# Patient Record
Sex: Female | Born: 1935 | Race: Black or African American | Hispanic: No | Marital: Single
Health system: Southern US, Community
[De-identification: ages and names within clinical notes are randomized; demographics above are authoritative.]

## PROBLEM LIST (undated history)

## (undated) DIAGNOSIS — S065X9A Traumatic subdural hemorrhage with loss of consciousness of unspecified duration, initial encounter: Secondary | ICD-10-CM

## (undated) DIAGNOSIS — G40909 Epilepsy, unspecified, not intractable, without status epilepticus: Secondary | ICD-10-CM

## (undated) DIAGNOSIS — N390 Urinary tract infection, site not specified: Secondary | ICD-10-CM

## (undated) DIAGNOSIS — E2740 Unspecified adrenocortical insufficiency: Secondary | ICD-10-CM

## (undated) DIAGNOSIS — I619 Nontraumatic intracerebral hemorrhage, unspecified: Secondary | ICD-10-CM

## (undated) DIAGNOSIS — S065XAA Traumatic subdural hemorrhage with loss of consciousness status unknown, initial encounter: Secondary | ICD-10-CM

## (undated) DIAGNOSIS — I69398 Other sequelae of cerebral infarction: Secondary | ICD-10-CM

---

## 2015-05-06 ENCOUNTER — Emergency Department (HOSPITAL_COMMUNITY): Payer: Medicare Other

## 2015-05-06 ENCOUNTER — Inpatient Hospital Stay (HOSPITAL_COMMUNITY)
Admission: EM | Admit: 2015-05-06 | Discharge: 2015-05-12 | DRG: 871 | Disposition: A | Discharge status: Skilled Nursing Facility | Payer: Medicare Other | Attending: Oncology | Admitting: Oncology

## 2015-05-06 ENCOUNTER — Inpatient Hospital Stay (HOSPITAL_COMMUNITY): Payer: Medicare Other

## 2015-05-06 DIAGNOSIS — T361X5A Adverse effect of cephalosporins and other beta-lactam antibiotics, initial encounter: Secondary | ICD-10-CM | POA: Diagnosis not present

## 2015-05-06 DIAGNOSIS — R402142 Coma scale, eyes open, spontaneous, at arrival to emergency department: Secondary | ICD-10-CM | POA: Diagnosis present

## 2015-05-06 DIAGNOSIS — K5909 Other constipation: Secondary | ICD-10-CM | POA: Diagnosis present

## 2015-05-06 DIAGNOSIS — I959 Hypotension, unspecified: Secondary | ICD-10-CM | POA: Diagnosis present

## 2015-05-06 DIAGNOSIS — N390 Urinary tract infection, site not specified: Secondary | ICD-10-CM | POA: Diagnosis present

## 2015-05-06 DIAGNOSIS — Y92230 Patient room in hospital as the place of occurrence of the external cause: Secondary | ICD-10-CM | POA: Diagnosis not present

## 2015-05-06 DIAGNOSIS — E2749 Other adrenocortical insufficiency: Secondary | ICD-10-CM | POA: Diagnosis present

## 2015-05-06 DIAGNOSIS — E2740 Unspecified adrenocortical insufficiency: Secondary | ICD-10-CM | POA: Diagnosis not present

## 2015-05-06 DIAGNOSIS — R402352 Coma scale, best motor response, localizes pain, at arrival to emergency department: Secondary | ICD-10-CM | POA: Diagnosis present

## 2015-05-06 DIAGNOSIS — Z85841 Personal history of malignant neoplasm of brain: Secondary | ICD-10-CM | POA: Diagnosis not present

## 2015-05-06 DIAGNOSIS — S01511A Laceration without foreign body of lip, initial encounter: Secondary | ICD-10-CM | POA: Diagnosis present

## 2015-05-06 DIAGNOSIS — G9389 Other specified disorders of brain: Secondary | ICD-10-CM | POA: Diagnosis present

## 2015-05-06 DIAGNOSIS — A419 Sepsis, unspecified organism: Secondary | ICD-10-CM | POA: Diagnosis present

## 2015-05-06 DIAGNOSIS — E162 Hypoglycemia, unspecified: Secondary | ICD-10-CM | POA: Diagnosis present

## 2015-05-06 DIAGNOSIS — R4182 Altered mental status, unspecified: Secondary | ICD-10-CM | POA: Diagnosis present

## 2015-05-06 DIAGNOSIS — R6889 Other general symptoms and signs: Secondary | ICD-10-CM | POA: Diagnosis present

## 2015-05-06 DIAGNOSIS — S066X0A Traumatic subarachnoid hemorrhage without loss of consciousness, initial encounter: Secondary | ICD-10-CM | POA: Diagnosis present

## 2015-05-06 DIAGNOSIS — I609 Nontraumatic subarachnoid hemorrhage, unspecified: Secondary | ICD-10-CM

## 2015-05-06 DIAGNOSIS — G9341 Metabolic encephalopathy: Secondary | ICD-10-CM | POA: Diagnosis present

## 2015-05-06 DIAGNOSIS — F0390 Unspecified dementia without behavioral disturbance: Secondary | ICD-10-CM | POA: Diagnosis present

## 2015-05-06 DIAGNOSIS — R001 Bradycardia, unspecified: Secondary | ICD-10-CM | POA: Diagnosis present

## 2015-05-06 DIAGNOSIS — G9340 Encephalopathy, unspecified: Secondary | ICD-10-CM | POA: Diagnosis not present

## 2015-05-06 DIAGNOSIS — W19XXXA Unspecified fall, initial encounter: Secondary | ICD-10-CM | POA: Diagnosis present

## 2015-05-06 DIAGNOSIS — I69898 Other sequelae of other cerebrovascular disease: Secondary | ICD-10-CM

## 2015-05-06 DIAGNOSIS — R680 Hypothermia, not associated with low environmental temperature: Secondary | ICD-10-CM | POA: Diagnosis present

## 2015-05-06 DIAGNOSIS — I69398 Other sequelae of cerebral infarction: Secondary | ICD-10-CM

## 2015-05-06 DIAGNOSIS — Z87440 Personal history of urinary (tract) infections: Secondary | ICD-10-CM | POA: Diagnosis not present

## 2015-05-06 DIAGNOSIS — D619 Aplastic anemia, unspecified: Secondary | ICD-10-CM | POA: Insufficient documentation

## 2015-05-06 DIAGNOSIS — R402242 Coma scale, best verbal response, confused conversation, at arrival to emergency department: Secondary | ICD-10-CM | POA: Diagnosis present

## 2015-05-06 DIAGNOSIS — N179 Acute kidney failure, unspecified: Secondary | ICD-10-CM | POA: Diagnosis present

## 2015-05-06 DIAGNOSIS — B9689 Other specified bacterial agents as the cause of diseases classified elsewhere: Secondary | ICD-10-CM | POA: Diagnosis not present

## 2015-05-06 DIAGNOSIS — Z66 Do not resuscitate: Secondary | ICD-10-CM | POA: Diagnosis present

## 2015-05-06 DIAGNOSIS — D61818 Other pancytopenia: Secondary | ICD-10-CM | POA: Diagnosis not present

## 2015-05-06 DIAGNOSIS — G40909 Epilepsy, unspecified, not intractable, without status epilepticus: Secondary | ICD-10-CM | POA: Diagnosis present

## 2015-05-06 DIAGNOSIS — D352 Benign neoplasm of pituitary gland: Secondary | ICD-10-CM | POA: Diagnosis present

## 2015-05-06 DIAGNOSIS — D61811 Other drug-induced pancytopenia: Secondary | ICD-10-CM | POA: Diagnosis not present

## 2015-05-06 HISTORY — DX: Urinary tract infection, site not specified: N39.0

## 2015-05-06 HISTORY — DX: Other sequelae of cerebral infarction: I69.398

## 2015-05-06 HISTORY — DX: Epilepsy, unspecified, not intractable, without status epilepticus: G40.909

## 2015-05-06 LAB — URINALYSIS, ROUTINE W REFLEX MICROSCOPIC
BILIRUBIN URINE: NEGATIVE
Glucose, UA: 100 mg/dL — AB
HGB URINE DIPSTICK: NEGATIVE
KETONES UR: NEGATIVE mg/dL
Nitrite: NEGATIVE
PROTEIN: NEGATIVE mg/dL
SPECIFIC GRAVITY, URINE: 1.014 (ref 1.005–1.030)
pH: 7.5 (ref 5.0–8.0)

## 2015-05-06 LAB — I-STAT CHEM 8, ED
BUN: 16 mg/dL (ref 6–20)
Calcium, Ion: 1.15 mmol/L (ref 1.13–1.30)
Chloride: 105 mmol/L (ref 101–111)
Creatinine, Ser: 1.3 mg/dL — ABNORMAL HIGH (ref 0.44–1.00)
Glucose, Bld: 78 mg/dL (ref 65–99)
HCT: 45 % (ref 36.0–46.0)
Hemoglobin: 15.3 g/dL — ABNORMAL HIGH (ref 12.0–15.0)
Potassium: 4.6 mmol/L (ref 3.5–5.1)
Sodium: 143 mmol/L (ref 135–145)
TCO2: 24 mmol/L (ref 0–100)

## 2015-05-06 LAB — PROTIME-INR
INR: 1.11 (ref 0.00–1.49)
Prothrombin Time: 14.5 seconds (ref 11.6–15.2)

## 2015-05-06 LAB — DIFFERENTIAL
Basophils Absolute: 0 K/uL (ref 0.0–0.1)
Basophils Relative: 0 %
Eosinophils Absolute: 0 K/uL (ref 0.0–0.7)
Eosinophils Relative: 0 %
Lymphocytes Relative: 16 %
Lymphs Abs: 0.9 K/uL (ref 0.7–4.0)
Monocytes Absolute: 0.4 K/uL (ref 0.1–1.0)
Monocytes Relative: 8 %
Neutro Abs: 4.1 K/uL (ref 1.7–7.7)
Neutrophils Relative %: 76 %

## 2015-05-06 LAB — CBC
HEMATOCRIT: 38.6 % (ref 36.0–46.0)
Hemoglobin: 13 g/dL (ref 12.0–15.0)
MCH: 29 pg (ref 26.0–34.0)
MCHC: 33.7 g/dL (ref 30.0–36.0)
MCV: 86.2 fL (ref 78.0–100.0)
PLATELETS: 151 10*3/uL (ref 150–400)
RBC: 4.48 MIL/uL (ref 3.87–5.11)
RDW: 15.3 % (ref 11.5–15.5)
WBC: 5.4 10*3/uL (ref 4.0–10.5)

## 2015-05-06 LAB — URINE MICROSCOPIC-ADD ON

## 2015-05-06 LAB — COMPREHENSIVE METABOLIC PANEL
ALK PHOS: 157 U/L — AB (ref 38–126)
ALT: 37 U/L (ref 14–54)
AST: 49 U/L — AB (ref 15–41)
Albumin: 3.6 g/dL (ref 3.5–5.0)
Anion gap: 16 — ABNORMAL HIGH (ref 5–15)
BUN: 13 mg/dL (ref 6–20)
CALCIUM: 9.5 mg/dL (ref 8.9–10.3)
CHLORIDE: 104 mmol/L (ref 101–111)
CO2: 24 mmol/L (ref 22–32)
CREATININE: 1.36 mg/dL — AB (ref 0.44–1.00)
GFR calc Af Amer: 39 mL/min — ABNORMAL LOW (ref >60)
GFR calc non Af Amer: 34 mL/min — ABNORMAL LOW (ref >60)
GLUCOSE: 83 mg/dL (ref 65–99)
Potassium: 4.8 mmol/L (ref 3.5–5.1)
SODIUM: 144 mmol/L (ref 135–145)
Total Bilirubin: 0.2 mg/dL — ABNORMAL LOW (ref 0.3–1.2)
Total Protein: 7 g/dL (ref 6.5–8.1)

## 2015-05-06 LAB — RAPID URINE DRUG SCREEN, HOSP PERFORMED
Amphetamines: NOT DETECTED
Barbiturates: NOT DETECTED
Benzodiazepines: NOT DETECTED
Cocaine: NOT DETECTED
Opiates: NOT DETECTED
Tetrahydrocannabinol: NOT DETECTED

## 2015-05-06 LAB — MAGNESIUM: Magnesium: 2.2 mg/dL (ref 1.7–2.4)

## 2015-05-06 LAB — TROPONIN I

## 2015-05-06 LAB — TSH: TSH: 1.348 u[IU]/mL (ref 0.350–4.500)

## 2015-05-06 LAB — CBG MONITORING, ED: GLUCOSE-CAPILLARY: 62 mg/dL — AB (ref 65–99)

## 2015-05-06 LAB — APTT: aPTT: 35 s (ref 24–37)

## 2015-05-06 LAB — PHOSPHORUS: PHOSPHORUS: 3.2 mg/dL (ref 2.5–4.6)

## 2015-05-06 LAB — I-STAT TROPONIN, ED: Troponin i, poc: 0 ng/mL (ref 0.00–0.08)

## 2015-05-06 LAB — MRSA PCR SCREENING: MRSA BY PCR: NEGATIVE

## 2015-05-06 LAB — ETHANOL

## 2015-05-06 MED ORDER — SODIUM CHLORIDE 0.9 % IV SOLN: 75.0000 mL/h | INTRAVENOUS | Status: DC

## 2015-05-06 MED ORDER — ACETAMINOPHEN 325 MG PO TABS: 650.0000 mg | ORAL_TABLET | ORAL | Status: DC | PRN

## 2015-05-06 MED ORDER — ONDANSETRON HCL 4 MG/2ML IJ SOLN: 4.0000 mg | Freq: Four times a day (QID) | INTRAMUSCULAR | Status: DC | PRN

## 2015-05-06 MED ORDER — LORAZEPAM 2 MG/ML IJ SOLN: 1.0000 mg | INTRAMUSCULAR | Status: DC | PRN

## 2015-05-06 MED ORDER — SODIUM CHLORIDE 0.9 % IV SOLN
INTRAVENOUS | Status: DC
  Administered 2015-05-06: 19:00:00 via INTRAVENOUS
  Administered 2015-05-07: 1000 mL via INTRAVENOUS

## 2015-05-06 MED ORDER — SODIUM CHLORIDE 0.9 % IV SOLN
1500.0000 mg | Freq: Two times a day (BID) | INTRAVENOUS | Status: DC
  Administered 2015-05-06 – 2015-05-09 (×6): 1500 mg via INTRAVENOUS
  Filled 2015-05-06 (×8): qty 15

## 2015-05-06 MED ORDER — ACETAMINOPHEN 650 MG RE SUPP: 650.0000 mg | RECTAL | Status: DC | PRN

## 2015-05-06 MED ORDER — ONDANSETRON HCL 4 MG PO TABS: 4.0000 mg | ORAL_TABLET | Freq: Four times a day (QID) | ORAL | Status: DC | PRN

## 2015-05-06 MED ORDER — DEXTROSE 5 % IV SOLN
1.0000 g | INTRAVENOUS | Status: DC
  Administered 2015-05-06 – 2015-05-08 (×3): 1 g via INTRAVENOUS
  Filled 2015-05-06 (×4): qty 10

## 2015-05-06 MED ORDER — DEXTROSE 50 % IV SOLN
50.0000 mL | Freq: Once | INTRAVENOUS | Status: AC
  Administered 2015-05-06: 50 mL via INTRAVENOUS
  Filled 2015-05-06: qty 50

## 2015-05-06 NOTE — Consult Note (Signed)
Requesting Physician: Dr. Rhina Brackett, ER resident    Reason for consultation: Altered mental status  HPI:                                                                                                                                         Mariah Arias is an 82 y.o. female patient who presented with with altered mental status from a group home. Patient has advanced dementia, history of some brain cancer and seizures from the available documentation, she is on Keppra 1000 mg twice a day. Patient unable to provide history no family members or STAFF AVAILABLE TO PROVIDE ANY ADDITIONAL HISTORY. INFORMATION OBTAINED FROM THE EMS STAFF WHO PICKED HER UP. After the patient was at her baseline and in walking in the hallway in the group home with a walker. At about 2 PM, She fell fell down, unknown if it's witnessed. Since the fall she has been having continued altered mental status. No definite information about any seizure-like activity.  Initial imaging studies in the ER showed mild subarachnoid hemorrhage along the vertex bilaterally on the CT of the head, chronic right frontoparietal encephalomalacia noted. CT of the maxillary bones showed no acute fracture although she does have some acute missing teeth which are noted clinically as well. She does have some bleeding inside her mouth..    Date last known well:  05/06/2015 Time last known well:  1400 tPA Given: No: Clinical evaluation concerning for seizures, subarachnoid hemorrhage on CT brain  Stroke Risk Factors - None  Past Medical History: No past medical history on file.  No past surgical history on file.  Family History: No family history on file.  Social History:   has no tobacco, alcohol, and drug history on file.  Allergies:  No Known Allergies   Medications:                                                                                                                         Current facility-administered medications:  .   dextrose 50 % solution 50 mL, 50 mL, Intravenous, Once, Ezequiel Essex, MD .  levETIRAcetam (KEPPRA) 1,500 mg in sodium chloride 0.9 % 100 mL IVPB, 1,500 mg, Intravenous, Q12H, Sandford Diop Fuller Mandril, MD  Current outpatient prescriptions:  .  docusate sodium (COLACE) 100 MG capsule, Take 100 mg by mouth 2 (two) times daily as needed for mild  constipation., Disp: , Rfl:  .  levETIRAcetam (KEPPRA) 1000 MG tablet, Take 1,000 mg by mouth 2 (two) times daily., Disp: , Rfl:  .  nitrofurantoin, macrocrystal-monohydrate, (MACROBID) 100 MG capsule, Take 100 mg by mouth daily., Disp: , Rfl:    ROS:                                                                                                                                       History  unobtainable from patient due to mental status   Neurologic Examination:                                                                                                    Blood pressure 99/59, pulse 56, temperature 92.7 F (33.7 C), temperature source Rectal, resp. rate 14, SpO2 99 %.  Patient is very drowsy, opens eyes to verbal commands. No specific gaze deviation. She does not follow commands nonverbal. Cranial nerve II visual grossly appear intact. She withdraws to stimulation in all 4 extremities,  Limited neurological exam-due to poor cooperation. No abnormal convulsive activity or tremors noted.      Lab Results: Basic Metabolic Panel:  Recent Labs Lab 05/06/15 1459  NA 143  K 4.6  CL 105  GLUCOSE 78  BUN 16  CREATININE 1.30*    Liver Function Tests: No results for input(s): AST, ALT, ALKPHOS, BILITOT, PROT, ALBUMIN in the last 168 hours. No results for input(s): LIPASE, AMYLASE in the last 168 hours. No results for input(s): AMMONIA in the last 168 hours.  CBC:  Recent Labs Lab 05/06/15 1448 05/06/15 1459  WBC 5.4  --   NEUTROABS 4.1  --   HGB 13.0 15.3*  HCT 38.6 45.0  MCV 86.2  --   PLT 151  --     Cardiac Enzymes: No  results for input(s): CKTOTAL, CKMB, CKMBINDEX, TROPONINI in the last 168 hours.  Lipid Panel: No results for input(s): CHOL, TRIG, HDL, CHOLHDL, VLDL, LDLCALC in the last 168 hours.  CBG:  Recent Labs Lab 05/06/15 1451  GLUCAP 62*    Microbiology: No results found for this or any previous visit.   Imaging: No results found.    Assessment and plan:   Mariah Arias is an 82 y.o. female patient who presented with altered mental status. She had a fall following that she is been noted to have persistent altered mental status. Circumstances leading to the fall are unknown. She has a history of a brain tumor listed in  her paperwork sent from the group home facility. Chronic encephalomalacia is noted in the right frontoparietal cortex which could be from her chronic stroke. Seizures are also listed in her medical problem list and she is supposedly on Keppra 1000 twice a day. I suspect that the etiology for her current presentation is secondary to seizures. Recommend loading dose of Keppra 1500 mg and continue the same dose every 12 hours area which on continuous EEG monitoring for further evaluation of a nonconvulsive electrographic seizures. Based on the EEG, will add seizure medications as needed. A small subarachnoid hemorrhage in the vertex is noted on CT brain.  She is admitted to the hospitalist service and a stepdown unit for close neurological monitoring.  Blood glucose of 62 in the ER. One-time dose of D50 given. Metabolic and infectious workup per hospitalist team. We'll follow-up.

## 2015-05-06 NOTE — ED Notes (Signed)
Family arrived; states she has a brain mass that they chose not to treat. States she was having frequent seizures until a year ago, but has been controlled with medication. Has not had one in a year.

## 2015-05-06 NOTE — Progress Notes (Signed)
Orthopedic Tech Progress Note Patient Details:  Mariah Arias [birth year] [id number] MADE LEVEL 2 TRAUMA VISIT Patient ID: Mariah Arias, female   DOB: 1924/02/27, 82 y.o.   MRN: [id number]   Mariah Arias 05/06/2015, 2:52 PM

## 2015-05-06 NOTE — ED Provider Notes (Signed)
MSE was initiated and I personally evaluated the patient and placed orders (if any) at  2:56 PM on May 06, 2015.  The patient appears stable so that the remainder of the MSE may be completed by another provider.  Patient brought in by EMS after witnessed fall. She was walking with her walker when she fell forward striking her face on a hard surface. No loss of consciousness. Since the fall patient is confused and altered and not following commands consistently. History of stroke and subdural hemorrhage in the past.  Patient is awake she is oriented to person and place. She is moving her extremities spontaneously but not consistently following commands. She has swelling and abrasion to the right side of her face there is a laceration to the mucosal surface of her upper lip and what appears to be a missing right incisor  There is no C-spine tenderness. Regular rate and rhythm, lungs are clear. Abdomen is soft nontender pelvis is stable. Moving R side more than left.   Patient last normal one hour ago. Unclear whether altered mental status preceded fall. Code stroke activated. CBG 62.  D50 ordered. Seizure is also considered.  Hx brain tumor on keppra.  BP 148/90 mmHg  Pulse 62  Temp(Src) 92.7 F (33.7 C) (Rectal)  Resp 15  SpO2 100%   Ezequiel Essex, MD 05/06/15 [id number]

## 2015-05-06 NOTE — ED Notes (Signed)
From an adult group home; fall while walking with caregiver. Usually able to converse normally; post fall unable to answer questions, repeating answers, and following commands intermittently.

## 2015-05-06 NOTE — H&P (Signed)
Date: 05/06/2015               Patient Name:  Mariah Arias MRN: [id number]  DOB: 1924/04/02 Age / Sex: 82 y.o., female   PCP: Noe Gens, MD         Medical Service: Internal Medicine Teaching Service         Attending Physician: Dr. Annia Belt, MD    First Contact: Dr. Zada Finders Pager: [phone]  Second Contact: Dr. Jacques Earthly Pager: [phone]       After Hours (After 5p/  First Contact Pager: [phone]  weekends / holidays): Second Contact Pager: [phone]   Chief Complaint: Fall  History of Present Illness: Mariah Arias is an 82 year old lady with PMH of Seizures on Keppra, Hemorrhagic Stroke in 2013, and recurrent UTIs on prophylactic Macrobid who presents after a fall. Majority of history is obtained from family and chart review as patient is not communicative due to mental status change. She was apparently walking out of the bathroom using her walker when she fell face forward onto the floor. She hit her head and had an avulsion of her right incisor tooth. This was witnessed by her caretaker, whom she lives with. Family is unaware of any preceding symptoms prior to the fall and unsure if the altered mental status occurred prior to fall or after. They do report that her prior seizures were similar in presentation in which she would have an "out of it" appearance with some confusion. They say she has never had tonic-clonic seizures. They report that she is usually talkative at baseline and can ambulate using a walker without much issue. They say her last seizure was one year ago and has been well-controlled on Keppra which they believe she is adherent. Family reports patient was noted to have a brain mass in the past which they decided not to treat/pursue further intervention. Her only other medications are Colace for chronic constipation and Macrobid prophylaxis for recurrent UTIs.  Family states that patient is a never smoker and never used alcohol or illicit  drugs. They report a family history of dementia in patient's sister. She has no known drug allergies. Her PCP is Dr. Simon Rhein in [place].  In the ED, vitals were: BP 148/90 mmHg  Pulse 62  Temp(Src) 92.7 F (33.7 C) (Rectal)  Resp 15  SpO2 100% Her blood glucose was 62 and she was given D50 once. CT Head showed old right frontoparietal ischemia, tiny areas of subarachnoid hemorrhage along the vertex bilaterally with no ventricular component. No acute infarct was seen. Maxillofacial CT showed mild soft tissue swelling over the right cheek, but no acute fracture. CT cervical spine shows multilevel degenerative changes without acute abnormality. Neurology and Neurosurgery were consulted.   Meds: Current Facility-Administered Medications  Medication Dose Route Frequency Provider Last Rate Last Dose  . 0.9 %  sodium chloride infusion   Intravenous Continuous Milagros Loll, MD 100 mL/hr at 05/06/15 1847    . acetaminophen (TYLENOL) tablet 650 mg  650 mg Oral Q4H PRN Milagros Loll, MD       Or  . acetaminophen (TYLENOL) suppository 650 mg  650 mg Rectal Q4H PRN Milagros Loll, MD      . levETIRAcetam (KEPPRA) 1,500 mg in sodium chloride 0.9 % 100 mL IVPB  1,500 mg Intravenous Q12H Ram Fuller Mandril, MD   Stopped at 05/06/15 1600  . LORazepam (ATIVAN) injection 1-2 mg  1-2 mg Intravenous  Q2H PRN Milagros Loll, MD      . ondansetron [facility]) tablet 4 mg  4 mg Oral Q6H PRN Milagros Loll, MD       Or  . ondansetron [facility]) injection 4 mg  4 mg Intravenous Q6H PRN Milagros Loll, MD        Allergies: Allergies as of 05/06/2015  . (No Known Allergies)   No past medical history on file. No past surgical history on file. No family history on file. Social History   Social History  . Marital Status: Single    Spouse Name: N/A  . Number of Children: N/A  . Years of Education: N/A   Occupational History  . Not on file.   Social History Main Topics  . Smoking  status: Not on file  . Smokeless tobacco: Not on file  . Alcohol Use: Not on file  . Drug Use: Not on file  . Sexual Activity: Not on file   Other Topics Concern  . Not on file   Social History Narrative  . No narrative on file    Review of Systems: Review of Systems  Unable to perform ROS: mental status change    Physical Exam: Blood pressure 116/62, pulse 59, temperature 93.6 F (34.2 C), temperature source Rectal, resp. rate 15, height 5\' 7"  (1.702 m), weight 162 lb 11.2 oz (73.8 kg), SpO2 100 %. Physical Exam  Constitutional: She appears well-developed and well-nourished. No distress.  Elderly woman, laying in bed, somnolent. Bear-hugger in place. Exam limited as patient not following commands.  HENT:  Right cheek and lips swollen, mucosal laceration inside the lip with some bleeding.  Eyes: Pupils are equal, round, and reactive to light.  Neck: Neck supple. No tracheal deviation present. No thyromegaly present.  Cardiovascular: Regular rhythm.  Bradycardia present.  Exam reveals distant heart sounds.   No murmur heard. Pulmonary/Chest: No respiratory distress. She exhibits no tenderness.  Breath sounds clear anteriorly.  Abdominal: Soft. There is no tenderness.  Neurological:  Neuro exam limited as patient not cooperative.  Skin: Skin is warm. She is not diaphoretic.  Psychiatric: She is slowed. She is inattentive.     Lab results: Basic Metabolic Panel:  Recent Labs  05/06/15 1448 05/06/15 1459  NA 144 143  K 4.8 4.6  CL 104 105  CO2 24  --   GLUCOSE 83 78  BUN 13 16  CREATININE 1.36* 1.30*  CALCIUM 9.5  --    Liver Function Tests:  Recent Labs  05/06/15 1448  AST 49*  ALT 37  ALKPHOS 157*  BILITOT 0.2*  PROT 7.0  ALBUMIN 3.6   No results for input(s): LIPASE, AMYLASE in the last 72 hours. No results for input(s): AMMONIA in the last 72 hours. CBC:  Recent Labs  05/06/15 1448 05/06/15 1459  WBC 5.4  --   NEUTROABS 4.1  --   HGB 13.0  15.3*  HCT 38.6 45.0  MCV 86.2  --   PLT 151  --    Cardiac Enzymes: No results for input(s): CKTOTAL, CKMB, CKMBINDEX, TROPONINI in the last 72 hours. BNP: No results for input(s): PROBNP in the last 72 hours. D-Dimer: No results for input(s): DDIMER in the last 72 hours. CBG:  Recent Labs  05/06/15 1451  GLUCAP 62*   Hemoglobin A1C: No results for input(s): HGBA1C in the last 72 hours. Fasting Lipid Panel: No results for input(s): CHOL, HDL, LDLCALC, TRIG, CHOLHDL, LDLDIRECT in the last 72 hours. Thyroid  Function Tests: No results for input(s): TSH, T4TOTAL, FREET4, T3FREE, THYROIDAB in the last 72 hours. Anemia Panel: No results for input(s): VITAMINB12, FOLATE, FERRITIN, TIBC, IRON, RETICCTPCT in the last 72 hours. Coagulation:  Recent Labs  05/06/15 1448  LABPROT 14.5  INR 1.11   Urine Drug Screen: Drugs of Abuse     Component Value Date/Time   LABOPIA NONE DETECTED 05/06/2015 1559   COCAINSCRNUR NONE DETECTED 05/06/2015 1559   LABBENZ NONE DETECTED 05/06/2015 1559   AMPHETMU NONE DETECTED 05/06/2015 1559   THCU NONE DETECTED 05/06/2015 1559   LABBARB NONE DETECTED 05/06/2015 1559    Alcohol Level:  Recent Labs  05/06/15 1448  ETH <5   Urinalysis:  Recent Labs  05/06/15 1559  COLORURINE YELLOW  LABSPEC 1.014  PHURINE 7.5  GLUCOSEU 100*  HGBUR NEGATIVE  BILIRUBINUR NEGATIVE  KETONESUR NEGATIVE  PROTEINUR NEGATIVE  NITRITE NEGATIVE  LEUKOCYTESUR TRACE*    Imaging results:  Ct Head Wo Contrast  05/06/2015  CLINICAL DATA:  Right facial swelling, un known history, likely fall EXAM: CT HEAD WITHOUT CONTRAST CT MAXILLOFACIAL WITHOUT CONTRAST CT CERVICAL SPINE WITHOUT CONTRAST TECHNIQUE: Multidetector CT imaging of the head, cervical spine, and maxillofacial structures were performed using the standard protocol without intravenous contrast. Multiplanar CT image reconstructions of the cervical spine and maxillofacial structures were also generated.  COMPARISON:  None. FINDINGS: CT HEAD FINDINGS Bony calvarium is intact.  No gross soft tissue abnormality is seen. Mild atrophic changes are seen. Areas of prior ischemia are noted in the right frontoparietal region. There are tiny areas of subarachnoid hemorrhage identified near the vertex bilaterally No ventricular component is noted. No acute infarct is noted. CT MAXILLOFACIAL FINDINGS The bony structures of face appear intact without evidence of acute fracture. The paranasal sinuses are within normal limits. The orbits and their contents are unremarkable. Mild soft tissue swelling is noted over the right cheek. The central incisor on the right is not present and appears to be of an acute nature. Correlation with the physical exam is recommended. Dental caries are seen. No other definitive acute tooth loss is seen. No other soft tissue abnormality is noted. CT CERVICAL SPINE FINDINGS Seven cervical segments are well visualized. Vertebral body height is well maintained. Multilevel osteophytic changes are seen. Facet hypertrophic changes are noted as well. No findings to suggest acute fracture or acute facet abnormality are seen. Visualized soft tissues are within normal limits. Mild carotid calcifications are seen. The lung apices are unremarkable as visualized. IMPRESSION: CT of the head: Changes consistent with mild subarachnoid hemorrhage along the vertex bilaterally. Prior right frontoparietal infarct. CT of the maxillofacial bones: No acute fracture is noted. There appears to be an acutely missing tooth involving the maxillary central incisor on the right. Clinical correlation is recommended. CT of the cervical spine: Multilevel degenerative change without acute abnormality. Critical Value/emergent results were called by telephone at the time of interpretation on 05/06/2015 at 3:38 pm to Dr. Rhina Brackett, who verbally acknowledged these results. Electronically Signed   By: Inez Catalina M.D.   On: 05/06/2015 15:41    Ct Cervical Spine Wo Contrast  05/06/2015  CLINICAL DATA:  Right facial swelling, un known history, likely fall EXAM: CT HEAD WITHOUT CONTRAST CT MAXILLOFACIAL WITHOUT CONTRAST CT CERVICAL SPINE WITHOUT CONTRAST TECHNIQUE: Multidetector CT imaging of the head, cervical spine, and maxillofacial structures were performed using the standard protocol without intravenous contrast. Multiplanar CT image reconstructions of the cervical spine and maxillofacial structures were also generated. COMPARISON:  None. FINDINGS: CT HEAD FINDINGS Bony calvarium is intact.  No gross soft tissue abnormality is seen. Mild atrophic changes are seen. Areas of prior ischemia are noted in the right frontoparietal region. There are tiny areas of subarachnoid hemorrhage identified near the vertex bilaterally No ventricular component is noted. No acute infarct is noted. CT MAXILLOFACIAL FINDINGS The bony structures of face appear intact without evidence of acute fracture. The paranasal sinuses are within normal limits. The orbits and their contents are unremarkable. Mild soft tissue swelling is noted over the right cheek. The central incisor on the right is not present and appears to be of an acute nature. Correlation with the physical exam is recommended. Dental caries are seen. No other definitive acute tooth loss is seen. No other soft tissue abnormality is noted. CT CERVICAL SPINE FINDINGS Seven cervical segments are well visualized. Vertebral body height is well maintained. Multilevel osteophytic changes are seen. Facet hypertrophic changes are noted as well. No findings to suggest acute fracture or acute facet abnormality are seen. Visualized soft tissues are within normal limits. Mild carotid calcifications are seen. The lung apices are unremarkable as visualized. IMPRESSION: CT of the head: Changes consistent with mild subarachnoid hemorrhage along the vertex bilaterally. Prior right frontoparietal infarct. CT of the maxillofacial  bones: No acute fracture is noted. There appears to be an acutely missing tooth involving the maxillary central incisor on the right. Clinical correlation is recommended. CT of the cervical spine: Multilevel degenerative change without acute abnormality. Critical Value/emergent results were called by telephone at the time of interpretation on 05/06/2015 at 3:38 pm to Dr. Rhina Brackett, who verbally acknowledged these results. Electronically Signed   By: Inez Catalina M.D.   On: 05/06/2015 15:41   Dg Chest Portable 1 View  05/06/2015  CLINICAL DATA:  Recent fall, initial encounter EXAM: PORTABLE CHEST 1 VIEW COMPARISON:  None. FINDINGS: Cardiac shadow is within normal limits. The lungs are well aerated bilaterally. No pneumothorax or effusion is seen. No acute bony abnormality is noted. IMPRESSION: No acute abnormality seen. Electronically Signed   By: Inez Catalina M.D.   On: 05/06/2015 15:44   Dg Hand Complete Left  05/06/2015  CLINICAL DATA:  Recent fall.  Hand swelling. EXAM: LEFT HAND - COMPLETE 3+ VIEW COMPARISON:  None FINDINGS: Pulse oximeter on the tip of the index finger. Negative for a fracture or dislocation. Alignment of the hand is normal. Soft tissues are unremarkable. Mild joint space narrowing in the interpharyngeal joints. IMPRESSION: No acute abnormality to the left hand. Electronically Signed   By: Markus Daft M.D.   On: 05/06/2015 15:44   Ct Maxillofacial Wo Cm  05/06/2015  CLINICAL DATA:  Right facial swelling, un known history, likely fall EXAM: CT HEAD WITHOUT CONTRAST CT MAXILLOFACIAL WITHOUT CONTRAST CT CERVICAL SPINE WITHOUT CONTRAST TECHNIQUE: Multidetector CT imaging of the head, cervical spine, and maxillofacial structures were performed using the standard protocol without intravenous contrast. Multiplanar CT image reconstructions of the cervical spine and maxillofacial structures were also generated. COMPARISON:  None. FINDINGS: CT HEAD FINDINGS Bony calvarium is intact.  No gross soft  tissue abnormality is seen. Mild atrophic changes are seen. Areas of prior ischemia are noted in the right frontoparietal region. There are tiny areas of subarachnoid hemorrhage identified near the vertex bilaterally No ventricular component is noted. No acute infarct is noted. CT MAXILLOFACIAL FINDINGS The bony structures of face appear intact without evidence of acute fracture. The paranasal sinuses are within normal limits. The orbits and  their contents are unremarkable. Mild soft tissue swelling is noted over the right cheek. The central incisor on the right is not present and appears to be of an acute nature. Correlation with the physical exam is recommended. Dental caries are seen. No other definitive acute tooth loss is seen. No other soft tissue abnormality is noted. CT CERVICAL SPINE FINDINGS Seven cervical segments are well visualized. Vertebral body height is well maintained. Multilevel osteophytic changes are seen. Facet hypertrophic changes are noted as well. No findings to suggest acute fracture or acute facet abnormality are seen. Visualized soft tissues are within normal limits. Mild carotid calcifications are seen. The lung apices are unremarkable as visualized. IMPRESSION: CT of the head: Changes consistent with mild subarachnoid hemorrhage along the vertex bilaterally. Prior right frontoparietal infarct. CT of the maxillofacial bones: No acute fracture is noted. There appears to be an acutely missing tooth involving the maxillary central incisor on the right. Clinical correlation is recommended. CT of the cervical spine: Multilevel degenerative change without acute abnormality. Critical Value/emergent results were called by telephone at the time of interpretation on 05/06/2015 at 3:38 pm to Dr. Rhina Brackett, who verbally acknowledged these results. Electronically Signed   By: Inez Catalina M.D.   On: 05/06/2015 15:41    Other results: EKG: sinus bradycardia, rate 60, j waves in II, aVF,  V4-V6  Assessment & Plan by Problem: Active Problems:   SAH (subarachnoid hemorrhage) ([place])   Subarachnoid bleed [facility])  Mariah Arias is an 82 year old lady with PMH of Seizures on Keppra, Hemorrhagic Stroke in 2013, and recurrent UTIs on prophylactic Macrobid who presents after a fall with altered mental status.   AMS with Fall: Patient with history of seizures and apparent fall and altered mental status. It is unclear if fall or AMS was present first. Per family she is talkative at her baseline and her current confusion and "out of it" appearance is consisted with prior seizures. She may be in a post-ictal state at this time. CT also notes small subarachnoid hemorrhage. Per ED physician, neurosurgery has evaluated and do not plan intervention at this time. Patient also has history of recurrent UTIs and is currently on prophylactic Macrobid. She is hypotensive and hypothermic which may indicate infection. UA and microscopy shows trace leukocytes and many bacteria. UDS and EtOH were negative. -Neurology following -Loading Keppra and overnight continuous EEG per neuro -Seizure precautions -TSH -BMP, Mg, Phos, CBC -Troponin -Telemetry -Urine culture -Blood culture x2 -SLP eval -Monitor mental status   AKI: SCr slightly elevated at 1.30. Baseline is unknown and family unaware of any renal disease. [place] secondary to decreased oral intake. -NS @ 100 mL/hr  Diet: NPO  DVT ppx: SCDs  Code: DNR   Dispo: Disposition is deferred at this time, awaiting improvement of current medical problems. Anticipated discharge in approximately 1-3 day(s).   The patient does have a current PCP  (Cedric Inez Catalina, MD) and does not need an [facility] hospital follow-up appointment after discharge.  The patient does have transportation limitations that hinder transportation to clinic appointments.  Signed: Zada Finders, MD 05/06/2015, 6:59 PM

## 2015-05-06 NOTE — Progress Notes (Signed)
Ceftriaxone for UTI per pharmacy ordered.  Ceftriaxone does not need renal adjustment.  [employer] policy allows pharmacy to change the ordered dose based on indication without contacting the physician, therefore a consult is not needed.   Plan: -ceftriaxone 1g IV q24h. Recommend 5 days of therapy if an uncomplicated UTI with susceptible organism. -pharmacy to sign off as no adjustment needed.   Dinia Joynt D. Jenaro Souder, PharmD, BCPS Clinical Pharmacist Pager: [phone] 05/06/2015 7:35 PM

## 2015-05-06 NOTE — ED Provider Notes (Signed)
CSN: [id number]     Arrival date & time 05/06/15  1449 History   None    Chief Complaint  Patient presents with  . Altered Mental Status  . Fall     (Consider location/radiation/quality/duration/timing/severity/associated sxs/prior Treatment) Patient is a 82 y.o. female presenting with fall and altered mental status. The history is provided by the patient, a relative and the spouse (daughter in law).  Fall This is a new problem. Episode onset: 2 pm (aproximately 50 minutes prior to arrival) Pertinent negatives include no visual change.  Altered Mental Status Presenting symptoms: lethargy and partial responsiveness   Severity:  Severe Most recent episode:  Today Duration:  1 hour Timing:  Constant Progression:  Partially resolved Chronicity:  Recurrent Context comment:  Patietn has hx of seizures (on keppra) and stroke. [place] today and has been altered. Not sure if altered mental status started before or after fall Associated symptoms: no slurred speech and no visual change     No past medical history on file. No past surgical history on file. No family history on file. Social History  Substance Use Topics  . Smoking status: Not on file  . Smokeless tobacco: Not on file  . Alcohol Use: Not on file   OB History    No data available     Review of Systems  Unable to perform ROS: Mental status change      Allergies  Review of patient's allergies indicates no known allergies.  Home Medications   Prior to Admission medications   Medication Sig Start Date End Date Taking? Authorizing Provider  docusate sodium (COLACE) 100 MG capsule Take 100 mg by mouth 2 (two) times daily as needed for mild constipation.   Yes Historical Provider, MD  levETIRAcetam (KEPPRA) 1000 MG tablet Take 1,000 mg by mouth 2 (two) times daily.   Yes Historical Provider, MD  nitrofurantoin, macrocrystal-monohydrate, (MACROBID) 100 MG capsule Take 100 mg by mouth daily.   Yes Historical Provider, MD    BP 131/61 mmHg  Pulse 88  Temp(Src) 93.6 F (34.2 C) (Rectal)  Resp 13  Ht 5\' 7"  (1.702 m)  Wt 73.8 kg  BMI 25.48 kg/m2  SpO2 99% Physical Exam  Constitutional: She appears well-developed and well-nourished.  HENT:  Head: Normocephalic.  Right Ear: External ear normal.  Left Ear: External ear normal.  Mouth/Throat: No oropharyngeal exudate.  Right sided lip swelling and small mucosal laceration with no acute bleeding. Right frontal incisor avulsion  Eyes: Conjunctivae and EOM are normal. Pupils are equal, round, and reactive to light. No scleral icterus.  Tacks well with eyes  Neck: Normal range of motion. Neck supple. No JVD present. No tracheal deviation present.  Ranging neck on her own  Cardiovascular: Normal rate, regular rhythm and intact distal pulses.  Exam reveals no friction rub.   Pulmonary/Chest: Effort normal and breath sounds normal. No respiratory distress. She has no wheezes. She has no rales.  Abdominal: Soft. Bowel sounds are normal. She exhibits no distension. There is no tenderness.  Musculoskeletal: Normal range of motion. She exhibits no edema or tenderness (no discernable tenderness to palpation of extremities.).  Neurological: She is alert. GCS eye subscore is 4. GCS verbal subscore is 4. GCS motor subscore is 5.  Skin: Skin is warm. No pallor.  Psychiatric: She expresses no homicidal and no suicidal ideation. She expresses no suicidal plans and no homicidal plans.  Nursing note and vitals reviewed.   ED Course  Procedures (including critical care time)  Labs Review Labs Reviewed  COMPREHENSIVE METABOLIC PANEL - Abnormal; Notable for the following:    Creatinine, Ser 1.36 (*)    AST 49 (*)    Alkaline Phosphatase 157 (*)    Total Bilirubin 0.2 (*)    GFR calc non Af Amer 34 (*)    GFR calc Af Amer 39 (*)    Anion gap 16 (*)    All other components within normal limits  URINALYSIS, ROUTINE W REFLEX MICROSCOPIC (NOT AT [facility]) - Abnormal; Notable for  the following:    APPearance CLOUDY (*)    Glucose, UA 100 (*)    Leukocytes, UA TRACE (*)    All other components within normal limits  URINE MICROSCOPIC-ADD ON - Abnormal; Notable for the following:    Squamous Epithelial / LPF 0-5 (*)    Bacteria, UA MANY (*)    All other components within normal limits  I-STAT CHEM 8, ED - Abnormal; Notable for the following:    Creatinine, Ser 1.30 (*)    Hemoglobin 15.3 (*)    All other components within normal limits  CBG MONITORING, ED - Abnormal; Notable for the following:    Glucose-Capillary 62 (*)    All other components within normal limits  MRSA PCR SCREENING  URINE CULTURE  CULTURE, BLOOD (ROUTINE X 2)  CULTURE, BLOOD (ROUTINE X 2)  ETHANOL  PROTIME-INR  APTT  CBC  DIFFERENTIAL  URINE RAPID DRUG SCREEN, HOSP PERFORMED  TSH  MAGNESIUM  PHOSPHORUS  TROPONIN I  BASIC METABOLIC PANEL  CBC  I-STAT TROPOININ, ED    Imaging Review Ct Head Wo Contrast  05/06/2015  CLINICAL DATA:  Right facial swelling, un known history, likely fall EXAM: CT HEAD WITHOUT CONTRAST CT MAXILLOFACIAL WITHOUT CONTRAST CT CERVICAL SPINE WITHOUT CONTRAST TECHNIQUE: Multidetector CT imaging of the head, cervical spine, and maxillofacial structures were performed using the standard protocol without intravenous contrast. Multiplanar CT image reconstructions of the cervical spine and maxillofacial structures were also generated. COMPARISON:  None. FINDINGS: CT HEAD FINDINGS Bony calvarium is intact.  No gross soft tissue abnormality is seen. Mild atrophic changes are seen. Areas of prior ischemia are noted in the right frontoparietal region. There are tiny areas of subarachnoid hemorrhage identified near the vertex bilaterally No ventricular component is noted. No acute infarct is noted. CT MAXILLOFACIAL FINDINGS The bony structures of face appear intact without evidence of acute fracture. The paranasal sinuses are within normal limits. The orbits and their contents  are unremarkable. Mild soft tissue swelling is noted over the right cheek. The central incisor on the right is not present and appears to be of an acute nature. Correlation with the physical exam is recommended. Dental caries are seen. No other definitive acute tooth loss is seen. No other soft tissue abnormality is noted. CT CERVICAL SPINE FINDINGS Seven cervical segments are well visualized. Vertebral body height is well maintained. Multilevel osteophytic changes are seen. Facet hypertrophic changes are noted as well. No findings to suggest acute fracture or acute facet abnormality are seen. Visualized soft tissues are within normal limits. Mild carotid calcifications are seen. The lung apices are unremarkable as visualized. IMPRESSION: CT of the head: Changes consistent with mild subarachnoid hemorrhage along the vertex bilaterally. Prior right frontoparietal infarct. CT of the maxillofacial bones: No acute fracture is noted. There appears to be an acutely missing tooth involving the maxillary central incisor on the right. Clinical correlation is recommended. CT of the cervical spine: Multilevel degenerative change without acute abnormality.  Critical Value/emergent results were called by telephone at the time of interpretation on 05/06/2015 at 3:38 pm to Dr. Rhina Brackett, who verbally acknowledged these results. Electronically Signed   By: Inez Catalina M.D.   On: 05/06/2015 15:41   Ct Cervical Spine Wo Contrast  05/06/2015  CLINICAL DATA:  Right facial swelling, un known history, likely fall EXAM: CT HEAD WITHOUT CONTRAST CT MAXILLOFACIAL WITHOUT CONTRAST CT CERVICAL SPINE WITHOUT CONTRAST TECHNIQUE: Multidetector CT imaging of the head, cervical spine, and maxillofacial structures were performed using the standard protocol without intravenous contrast. Multiplanar CT image reconstructions of the cervical spine and maxillofacial structures were also generated. COMPARISON:  None. FINDINGS: CT HEAD FINDINGS Bony  calvarium is intact.  No gross soft tissue abnormality is seen. Mild atrophic changes are seen. Areas of prior ischemia are noted in the right frontoparietal region. There are tiny areas of subarachnoid hemorrhage identified near the vertex bilaterally No ventricular component is noted. No acute infarct is noted. CT MAXILLOFACIAL FINDINGS The bony structures of face appear intact without evidence of acute fracture. The paranasal sinuses are within normal limits. The orbits and their contents are unremarkable. Mild soft tissue swelling is noted over the right cheek. The central incisor on the right is not present and appears to be of an acute nature. Correlation with the physical exam is recommended. Dental caries are seen. No other definitive acute tooth loss is seen. No other soft tissue abnormality is noted. CT CERVICAL SPINE FINDINGS Seven cervical segments are well visualized. Vertebral body height is well maintained. Multilevel osteophytic changes are seen. Facet hypertrophic changes are noted as well. No findings to suggest acute fracture or acute facet abnormality are seen. Visualized soft tissues are within normal limits. Mild carotid calcifications are seen. The lung apices are unremarkable as visualized. IMPRESSION: CT of the head: Changes consistent with mild subarachnoid hemorrhage along the vertex bilaterally. Prior right frontoparietal infarct. CT of the maxillofacial bones: No acute fracture is noted. There appears to be an acutely missing tooth involving the maxillary central incisor on the right. Clinical correlation is recommended. CT of the cervical spine: Multilevel degenerative change without acute abnormality. Critical Value/emergent results were called by telephone at the time of interpretation on 05/06/2015 at 3:38 pm to Dr. Rhina Brackett, who verbally acknowledged these results. Electronically Signed   By: Inez Catalina M.D.   On: 05/06/2015 15:41   Dg Chest Portable 1 View  05/06/2015  CLINICAL  DATA:  Recent fall, initial encounter EXAM: PORTABLE CHEST 1 VIEW COMPARISON:  None. FINDINGS: Cardiac shadow is within normal limits. The lungs are well aerated bilaterally. No pneumothorax or effusion is seen. No acute bony abnormality is noted. IMPRESSION: No acute abnormality seen. Electronically Signed   By: Inez Catalina M.D.   On: 05/06/2015 15:44   Dg Hand Complete Left  05/06/2015  CLINICAL DATA:  Recent fall.  Hand swelling. EXAM: LEFT HAND - COMPLETE 3+ VIEW COMPARISON:  None FINDINGS: Pulse oximeter on the tip of the index finger. Negative for a fracture or dislocation. Alignment of the hand is normal. Soft tissues are unremarkable. Mild joint space narrowing in the interpharyngeal joints. IMPRESSION: No acute abnormality to the left hand. Electronically Signed   By: Markus Daft M.D.   On: 05/06/2015 15:44   Ct Maxillofacial Wo Cm  05/06/2015  CLINICAL DATA:  Right facial swelling, un known history, likely fall EXAM: CT HEAD WITHOUT CONTRAST CT MAXILLOFACIAL WITHOUT CONTRAST CT CERVICAL SPINE WITHOUT CONTRAST TECHNIQUE: Multidetector CT imaging of the  head, cervical spine, and maxillofacial structures were performed using the standard protocol without intravenous contrast. Multiplanar CT image reconstructions of the cervical spine and maxillofacial structures were also generated. COMPARISON:  None. FINDINGS: CT HEAD FINDINGS Bony calvarium is intact.  No gross soft tissue abnormality is seen. Mild atrophic changes are seen. Areas of prior ischemia are noted in the right frontoparietal region. There are tiny areas of subarachnoid hemorrhage identified near the vertex bilaterally No ventricular component is noted. No acute infarct is noted. CT MAXILLOFACIAL FINDINGS The bony structures of face appear intact without evidence of acute fracture. The paranasal sinuses are within normal limits. The orbits and their contents are unremarkable. Mild soft tissue swelling is noted over the right cheek. The  central incisor on the right is not present and appears to be of an acute nature. Correlation with the physical exam is recommended. Dental caries are seen. No other definitive acute tooth loss is seen. No other soft tissue abnormality is noted. CT CERVICAL SPINE FINDINGS Seven cervical segments are well visualized. Vertebral body height is well maintained. Multilevel osteophytic changes are seen. Facet hypertrophic changes are noted as well. No findings to suggest acute fracture or acute facet abnormality are seen. Visualized soft tissues are within normal limits. Mild carotid calcifications are seen. The lung apices are unremarkable as visualized. IMPRESSION: CT of the head: Changes consistent with mild subarachnoid hemorrhage along the vertex bilaterally. Prior right frontoparietal infarct. CT of the maxillofacial bones: No acute fracture is noted. There appears to be an acutely missing tooth involving the maxillary central incisor on the right. Clinical correlation is recommended. CT of the cervical spine: Multilevel degenerative change without acute abnormality. Critical Value/emergent results were called by telephone at the time of interpretation on 05/06/2015 at 3:38 pm to Dr. Rhina Brackett, who verbally acknowledged these results. Electronically Signed   By: Inez Catalina M.D.   On: 05/06/2015 15:41   I have personally reviewed and evaluated these images and lab results as part of my medical decision-making.   EKG Interpretation   Date/Time:  Saturday May 06 2015 15:20:21 EDT Ventricular Rate:  61 PR Interval:  182 QRS Duration: 124 QT Interval:  481 QTC Calculation: 484 R Axis:   54 Text Interpretation:  Sinus rhythm Nonspecific intraventricular conduction  delay Borderline T abnormalities, lateral leads No previous ECGs available  Confirmed by Wyvonnia Dusky  MD, STEPHEN [phone]) on 05/06/2015 3:32:39 PM      MDM   Final diagnoses:  SAH (subarachnoid hemorrhage) ([place])    The patient is a  82 year old female with a history of seizures currently on Keppra as well as previous stroke who presents one hour after suffering a fall. Patient with altered mental status. Unclear if altered mental status proceeded fall or not. Code stroke called on arrival due to change in mental status. Head CT shows small subarachnoid hemorrhage. Remainder of physical exam as above. Patient fails to return to baseline. Neurology will perform an EEG to assess for ongoing seizures. Patient admitted to hospital service for further management. Patient's family expresses understanding and agreement with this plan.  Patient seen with attending, Dr. Reather Converse, who oversaw clinical decision making.     Margaretann Loveless, MD 05/07/15 [id number]  Elnora Morrison, MD 05/08/15 [phone]

## 2015-05-06 NOTE — Progress Notes (Signed)
LTM EEG started 

## 2015-05-07 ENCOUNTER — Encounter (HOSPITAL_COMMUNITY): Payer: Self-pay | Admitting: Oncology

## 2015-05-07 DIAGNOSIS — Z87440 Personal history of urinary (tract) infections: Secondary | ICD-10-CM

## 2015-05-07 DIAGNOSIS — R680 Hypothermia, not associated with low environmental temperature: Secondary | ICD-10-CM

## 2015-05-07 DIAGNOSIS — I69398 Other sequelae of cerebral infarction: Secondary | ICD-10-CM

## 2015-05-07 DIAGNOSIS — N390 Urinary tract infection, site not specified: Secondary | ICD-10-CM

## 2015-05-07 DIAGNOSIS — Z9181 History of falling: Secondary | ICD-10-CM

## 2015-05-07 DIAGNOSIS — G9340 Encephalopathy, unspecified: Secondary | ICD-10-CM | POA: Diagnosis present

## 2015-05-07 DIAGNOSIS — G40509 Epileptic seizures related to external causes, not intractable, without status epilepticus: Secondary | ICD-10-CM

## 2015-05-07 DIAGNOSIS — E162 Hypoglycemia, unspecified: Secondary | ICD-10-CM

## 2015-05-07 DIAGNOSIS — N179 Acute kidney failure, unspecified: Secondary | ICD-10-CM

## 2015-05-07 DIAGNOSIS — G40909 Epilepsy, unspecified, not intractable, without status epilepticus: Secondary | ICD-10-CM

## 2015-05-07 DIAGNOSIS — R001 Bradycardia, unspecified: Secondary | ICD-10-CM

## 2015-05-07 HISTORY — DX: Urinary tract infection, site not specified: N39.0

## 2015-05-07 HISTORY — DX: Epilepsy, unspecified, not intractable, without status epilepticus: G40.909

## 2015-05-07 LAB — BASIC METABOLIC PANEL
Anion gap: 11 (ref 5–15)
BUN: 13 mg/dL (ref 6–20)
CHLORIDE: 109 mmol/L (ref 101–111)
CO2: 23 mmol/L (ref 22–32)
CREATININE: 1.36 mg/dL — AB (ref 0.44–1.00)
Calcium: 9.2 mg/dL (ref 8.9–10.3)
GFR, EST AFRICAN AMERICAN: 39 mL/min — AB (ref >60)
GFR, EST NON AFRICAN AMERICAN: 34 mL/min — AB (ref >60)
Glucose, Bld: 87 mg/dL (ref 65–99)
Potassium: 4.5 mmol/L (ref 3.5–5.1)
SODIUM: 143 mmol/L (ref 135–145)

## 2015-05-07 LAB — URINALYSIS, ROUTINE W REFLEX MICROSCOPIC
BILIRUBIN URINE: NEGATIVE
Glucose, UA: NEGATIVE mg/dL
KETONES UR: NEGATIVE mg/dL
NITRITE: NEGATIVE
PROTEIN: NEGATIVE mg/dL
SPECIFIC GRAVITY, URINE: 1.009 (ref 1.005–1.030)
pH: 7.5 (ref 5.0–8.0)

## 2015-05-07 LAB — CBC
HCT: 35.2 % — ABNORMAL LOW (ref 36.0–46.0)
Hemoglobin: 12.3 g/dL (ref 12.0–15.0)
MCH: 29.5 pg (ref 26.0–34.0)
MCHC: 34.9 g/dL (ref 30.0–36.0)
MCV: 84.4 fL (ref 78.0–100.0)
PLATELETS: 158 10*3/uL (ref 150–400)
RBC: 4.17 MIL/uL (ref 3.87–5.11)
RDW: 14.8 % (ref 11.5–15.5)
WBC: 4.6 10*3/uL (ref 4.0–10.5)

## 2015-05-07 LAB — URINE MICROSCOPIC-ADD ON
Bacteria, UA: NONE SEEN
RBC / HPF: NONE SEEN RBC/hpf (ref 0–5)

## 2015-05-07 LAB — GLUCOSE, CAPILLARY: Glucose-Capillary: 96 mg/dL (ref 65–99)

## 2015-05-07 MED ORDER — DEXTROSE-NACL 5-0.45 % IV SOLN
INTRAVENOUS | Status: DC
  Administered 2015-05-07 – 2015-05-08 (×2): 100 mL via INTRAVENOUS
  Administered 2015-05-09: 03:00:00 via INTRAVENOUS

## 2015-05-07 MED ORDER — VANCOMYCIN HCL IN DEXTROSE 750-5 MG/150ML-% IV SOLN
750.0000 mg | INTRAVENOUS | Status: DC
  Administered 2015-05-07 – 2015-05-08 (×2): 750 mg via INTRAVENOUS
  Filled 2015-05-07 (×3): qty 150

## 2015-05-07 NOTE — Progress Notes (Signed)
+  Pharmacy Antibiotic Note  Mariah Arias is a 82 y.o. female admitted on 05/06/2015 with sepsis.  Pharmacy has been consulted for vancomycin dosing.  Plan: Vancomycin 750 IV every 24 hours.  Goal trough 15-20 mcg/mL.  Monitor C&S, CBC, renal function and VT at steady state as needed  Height: 5\' 7"  (170.2 cm) Weight: 162 lb 11.2 oz (73.8 kg) IBW/kg (Calculated) : 61.6  Temp (24hrs), Avg:95.2 F (35.1 C), Min:92.7 F (33.7 C), Max:98 F (36.7 C)   Recent Labs Lab 05/06/15 1448 05/06/15 1459 05/07/15 0313  WBC 5.4  --  4.6  CREATININE 1.36* 1.30* 1.36*    Estimated Creatinine Clearance: 27.8 mL/min (by C-G formula based on Cr of 1.36).    No Known Allergies  Antimicrobials this admission: CTX 3/18>> Vancomycin 3/19>>  Microbiology results: 3/18 UCx - ngtd 3/18 BCx -  3/18 MRSA PCR - negative  Thank you for allowing pharmacy to be a part of this patient's care.  Dimitri Ped, PharmD. PGY-1 Pharmacy Resident Pager: [phone] 05/07/2015 1:23 PM

## 2015-05-07 NOTE — Progress Notes (Signed)
   Subjective: Patient still lethargic and somnolent when seen today. She will follow some commands and speak one or two words. Only slightly more interaction compared to yesterday. Objective: Vital signs in last 24 hours: Filed Vitals:   05/07/15 0742 05/07/15 0800 05/07/15 0900 05/07/15 1113  BP: 123/83 103/60 105/53 91/53  Pulse: 61 62 85 50  Temp: 97.3 F (36.3 C)   94.3 F (34.6 C)  TempSrc: Axillary   Core (Comment)  Resp: 12 17 14 13   Height:      Weight:      SpO2: 96% 100% 82% 100%   Weight change:   Intake/Output Summary (Last 24 hours) at 05/07/15 1333 Last data filed at 05/07/15 0900  Gross per 24 hour  Intake 1536.67 ml  Output     50 ml  Net 1486.67 ml   General: resting in bed, somnolent HEENT: PERRL Cardiac: distant heart sounds, brady with regular rhythm. 2/6 systolic murmur heard best at the LUS border Pulm: clear to auscultation anteriorly Abd: soft, nontender, nondistended Ext: no pedal edema, moves all extremities spontaneously Neuro: lethargic, occasionally attempts to follow commands, will not squeeze fingers or move legs/feet when asked, grimaces with stimulation and withdraws feet  Assessment/Plan: Active Problems:   SAH (subarachnoid hemorrhage) ([facility])   Subarachnoid bleed ([place])   Complicated UTI (urinary tract infection)   Seizure disorder as sequela of cerebrovascular accident [facility])  UTI: Patient with history of seizures and apparent fall and altered mental status. It is unclear if fall or AMS was present first. Per family she is talkative at her baseline and her current confusion and "out of it" appearance is consistent with prior seizures. She likely has a UTI which lowered her seizure threshold with resultant fall. Will add Vancomycin to Ceftriaxone for empiric coverage of meningitis -IV Ceftriaxone 1g q24h -Add Vancomycin per pharmacy. -Telemetry -f/u urine and blood cultures -Monitor mental status  Seizure disorder: Neurology  following -Keppra per Neurology -Overnight continuous EEG completed, pending read -Seizure precautions  AKI: SCr slightly elevated at 1.36. Baseline appears to be around 0.80-0.90 -NS @ 100 mL/hr   Dispo: Disposition is deferred at this time, awaiting improvement of current medical problems.  Anticipated discharge in approximately 2-3 day(s).      LOS: 1 day   Zada Finders, MD 05/07/2015, 1:33 PM

## 2015-05-07 NOTE — Progress Notes (Signed)
SLP Cancellation Note  Patient Details Name: Cortne Kowalke MRN: [id number] DOB: 1924-07-07   Cancelled treatment:       Reason Eval/Treat Not Completed: Fatigue/lethargy limiting ability to participate.  The patient was unable to rouse enough to safely participate in clinical swallowing evaluation.  ST will follow up next date.    Shelly Flatten, MA, [facility] SLP [phone] Lamar Sprinkles 05/07/2015, 8:33 AM

## 2015-05-07 NOTE — Progress Notes (Signed)
Interval History:                                                                                                                      Mariah Arias is an 82 y.o. female patient admitted with altered mental status. She is hypothermic. She is noted to have a UTI, on antibiotics. She had a fall with a small subacute hemorrhage. Continuous EEG monitoring overnight showed evidence of encephalopathy, no seizures.  She remains poorly responsive and able to provide any history. No family at bedside.   Past Medical History: Past Medical History  Diagnosis Date  . Complicated UTI (urinary tract infection) 05/07/2015  . Seizure disorder as sequela of cerebrovascular accident ([place]) 05/07/2015    No past surgical history on file.  Family History: No family history on file.  Social History:   has no tobacco, alcohol, and drug history on file.  Allergies:  No Known Allergies   Medications:                                                                                                                         Current facility-administered medications:  .  acetaminophen (TYLENOL) tablet 650 mg, 650 mg, Oral, Q4H PRN **OR** acetaminophen (TYLENOL) suppository 650 mg, 650 mg, Rectal, Q4H PRN, Milagros Loll, MD .  cefTRIAXone (ROCEPHIN) 1 g in dextrose 5 % 50 mL IVPB, 1 g, Intravenous, Q24H, Lauren D Bajbus, RPH, 1 g at 05/07/15 2049 .  dextrose 5 %-0.45 % sodium chloride infusion, , Intravenous, Continuous, Maryellen Pile, MD, Last Rate: 100 mL/hr at 05/07/15 2053, 100 mL at 05/07/15 2053 .  levETIRAcetam (KEPPRA) 1,500 mg in sodium chloride 0.9 % 100 mL IVPB, 1,500 mg, Intravenous, Q12H, Krystyne Tewksbury Fuller Mandril, MD, Last Rate: 460 mL/hr at 05/07/15 1600, 1,500 mg at 05/07/15 1600 .  LORazepam (ATIVAN) injection 1-2 mg, 1-2 mg, Intravenous, Q2H PRN, Milagros Loll, MD .  ondansetron [facility]) tablet 4 mg, 4 mg, Oral, Q6H PRN **OR** ondansetron (ZOFRAN) injection 4 mg, 4 mg,  Intravenous, Q6H PRN, Milagros Loll, MD .  vancomycin (VANCOCIN) IVPB 750 mg/150 ml premix, 750 mg, Intravenous, Q24H, Assunta Found Stone, RPH, 750 mg at 05/07/15 1400   Neurologic Examination:  Today's Vitals   05/07/15 1700 05/07/15 1949 05/07/15 2000 05/07/15 2200  BP:  109/56 112/53 94/49  Pulse: 84 86 89 80  Temp: 98.8 F (37.1 C) 100.2 F (37.9 C) 100.2 F (37.9 C) 100.2 F (37.9 C)  TempSrc:  Core (Comment) Core (Comment)   Resp: 14 28 19 14   Height:      Weight:      SpO2: 99% 98% 98% 97%  PainSc:       Very drowsy, not arousable to verbal commands or tactile stimulus. No gaze deviation noted no abnormal involuntary movements are noted. She withdraws to establish in all extremities.    Lab Results: Basic Metabolic Panel:  Recent Labs Lab 05/06/15 1448 05/06/15 1459 05/06/15 1859 05/07/15 0313  NA 144 143  --  143  K 4.8 4.6  --  4.5  CL 104 105  --  109  CO2 24  --   --  23  GLUCOSE 83 78  --  87  BUN 13 16  --  13  CREATININE 1.36* 1.30*  --  1.36*  CALCIUM 9.5  --   --  9.2  MG  --   --  2.2  --   PHOS  --   --  3.2  --     Liver Function Tests:  Recent Labs Lab 05/06/15 1448  AST 49*  ALT 37  ALKPHOS 157*  BILITOT 0.2*  PROT 7.0  ALBUMIN 3.6   No results for input(s): LIPASE, AMYLASE in the last 168 hours. No results for input(s): AMMONIA in the last 168 hours.  CBC:  Recent Labs Lab 05/06/15 1448 05/06/15 1459 05/07/15 0313  WBC 5.4  --  4.6  NEUTROABS 4.1  --   --   HGB 13.0 15.3* 12.3  HCT 38.6 45.0 35.2*  MCV 86.2  --  84.4  PLT 151  --  158    Cardiac Enzymes:  Recent Labs Lab 05/06/15 1859  TROPONINI <0.03    Lipid Panel: No results for input(s): CHOL, TRIG, HDL, CHOLHDL, VLDL, LDLCALC in the last 168 hours.  CBG:  Recent Labs Lab 05/06/15 1451 05/07/15 1955  GLUCAP 62* 96    Microbiology: Results for orders  placed or performed during the hospital encounter of 05/06/15  Culture, Urine     Status: None (Preliminary result)   Collection Time: 05/06/15  3:59 PM  Result Value Ref Range Status   Specimen Description URINE, RANDOM  Final   Special Requests NONE  Final   Culture NO GROWTH < 24 HOURS  Final   Report Status PENDING  Incomplete  MRSA PCR Screening     Status: None   Collection Time: 05/06/15  6:42 PM  Result Value Ref Range Status   MRSA by PCR NEGATIVE NEGATIVE Final    Comment:        The GeneXpert MRSA Assay (FDA approved for NASAL specimens only), is one component of a comprehensive MRSA colonization surveillance program. It is not intended to diagnose MRSA infection nor to guide or monitor treatment for MRSA infections.     Imaging: Ct Head Wo Contrast  05/06/2015  CLINICAL DATA:  Right facial swelling, un known history, likely fall EXAM: CT HEAD WITHOUT CONTRAST CT MAXILLOFACIAL WITHOUT CONTRAST CT CERVICAL SPINE WITHOUT CONTRAST TECHNIQUE: Multidetector CT imaging of the head, cervical spine, and maxillofacial structures were performed using the standard protocol without intravenous contrast. Multiplanar CT image reconstructions of the cervical spine and maxillofacial structures were also generated.  COMPARISON:  None. FINDINGS: CT HEAD FINDINGS Bony calvarium is intact.  No gross soft tissue abnormality is seen. Mild atrophic changes are seen. Areas of prior ischemia are noted in the right frontoparietal region. There are tiny areas of subarachnoid hemorrhage identified near the vertex bilaterally No ventricular component is noted. No acute infarct is noted. CT MAXILLOFACIAL FINDINGS The bony structures of face appear intact without evidence of acute fracture. The paranasal sinuses are within normal limits. The orbits and their contents are unremarkable. Mild soft tissue swelling is noted over the right cheek. The central incisor on the right is not present and appears to be of  an acute nature. Correlation with the physical exam is recommended. Dental caries are seen. No other definitive acute tooth loss is seen. No other soft tissue abnormality is noted. CT CERVICAL SPINE FINDINGS Seven cervical segments are well visualized. Vertebral body height is well maintained. Multilevel osteophytic changes are seen. Facet hypertrophic changes are noted as well. No findings to suggest acute fracture or acute facet abnormality are seen. Visualized soft tissues are within normal limits. Mild carotid calcifications are seen. The lung apices are unremarkable as visualized. IMPRESSION: CT of the head: Changes consistent with mild subarachnoid hemorrhage along the vertex bilaterally. Prior right frontoparietal infarct. CT of the maxillofacial bones: No acute fracture is noted. There appears to be an acutely missing tooth involving the maxillary central incisor on the right. Clinical correlation is recommended. CT of the cervical spine: Multilevel degenerative change without acute abnormality. Critical Value/emergent results were called by telephone at the time of interpretation on 05/06/2015 at 3:38 pm to Dr. Rhina Brackett, who verbally acknowledged these results. Electronically Signed   By: Inez Catalina M.D.   On: 05/06/2015 15:41   Ct Cervical Spine Wo Contrast  05/06/2015  CLINICAL DATA:  Right facial swelling, un known history, likely fall EXAM: CT HEAD WITHOUT CONTRAST CT MAXILLOFACIAL WITHOUT CONTRAST CT CERVICAL SPINE WITHOUT CONTRAST TECHNIQUE: Multidetector CT imaging of the head, cervical spine, and maxillofacial structures were performed using the standard protocol without intravenous contrast. Multiplanar CT image reconstructions of the cervical spine and maxillofacial structures were also generated. COMPARISON:  None. FINDINGS: CT HEAD FINDINGS Bony calvarium is intact.  No gross soft tissue abnormality is seen. Mild atrophic changes are seen. Areas of prior ischemia are noted in the right  frontoparietal region. There are tiny areas of subarachnoid hemorrhage identified near the vertex bilaterally No ventricular component is noted. No acute infarct is noted. CT MAXILLOFACIAL FINDINGS The bony structures of face appear intact without evidence of acute fracture. The paranasal sinuses are within normal limits. The orbits and their contents are unremarkable. Mild soft tissue swelling is noted over the right cheek. The central incisor on the right is not present and appears to be of an acute nature. Correlation with the physical exam is recommended. Dental caries are seen. No other definitive acute tooth loss is seen. No other soft tissue abnormality is noted. CT CERVICAL SPINE FINDINGS Seven cervical segments are well visualized. Vertebral body height is well maintained. Multilevel osteophytic changes are seen. Facet hypertrophic changes are noted as well. No findings to suggest acute fracture or acute facet abnormality are seen. Visualized soft tissues are within normal limits. Mild carotid calcifications are seen. The lung apices are unremarkable as visualized. IMPRESSION: CT of the head: Changes consistent with mild subarachnoid hemorrhage along the vertex bilaterally. Prior right frontoparietal infarct. CT of the maxillofacial bones: No acute fracture is noted. There appears to be  an acutely missing tooth involving the maxillary central incisor on the right. Clinical correlation is recommended. CT of the cervical spine: Multilevel degenerative change without acute abnormality. Critical Value/emergent results were called by telephone at the time of interpretation on 05/06/2015 at 3:38 pm to Dr. Rhina Brackett, who verbally acknowledged these results. Electronically Signed   By: Inez Catalina M.D.   On: 05/06/2015 15:41   Dg Chest Portable 1 View  05/06/2015  CLINICAL DATA:  Recent fall, initial encounter EXAM: PORTABLE CHEST 1 VIEW COMPARISON:  None. FINDINGS: Cardiac shadow is within normal limits. The lungs  are well aerated bilaterally. No pneumothorax or effusion is seen. No acute bony abnormality is noted. IMPRESSION: No acute abnormality seen. Electronically Signed   By: Inez Catalina M.D.   On: 05/06/2015 15:44   Dg Hand Complete Left  05/06/2015  CLINICAL DATA:  Recent fall.  Hand swelling. EXAM: LEFT HAND - COMPLETE 3+ VIEW COMPARISON:  None FINDINGS: Pulse oximeter on the tip of the index finger. Negative for a fracture or dislocation. Alignment of the hand is normal. Soft tissues are unremarkable. Mild joint space narrowing in the interpharyngeal joints. IMPRESSION: No acute abnormality to the left hand. Electronically Signed   By: Markus Daft M.D.   On: 05/06/2015 15:44   Ct Maxillofacial Wo Cm  05/06/2015  CLINICAL DATA:  Right facial swelling, un known history, likely fall EXAM: CT HEAD WITHOUT CONTRAST CT MAXILLOFACIAL WITHOUT CONTRAST CT CERVICAL SPINE WITHOUT CONTRAST TECHNIQUE: Multidetector CT imaging of the head, cervical spine, and maxillofacial structures were performed using the standard protocol without intravenous contrast. Multiplanar CT image reconstructions of the cervical spine and maxillofacial structures were also generated. COMPARISON:  None. FINDINGS: CT HEAD FINDINGS Bony calvarium is intact.  No gross soft tissue abnormality is seen. Mild atrophic changes are seen. Areas of prior ischemia are noted in the right frontoparietal region. There are tiny areas of subarachnoid hemorrhage identified near the vertex bilaterally No ventricular component is noted. No acute infarct is noted. CT MAXILLOFACIAL FINDINGS The bony structures of face appear intact without evidence of acute fracture. The paranasal sinuses are within normal limits. The orbits and their contents are unremarkable. Mild soft tissue swelling is noted over the right cheek. The central incisor on the right is not present and appears to be of an acute nature. Correlation with the physical exam is recommended. Dental caries are  seen. No other definitive acute tooth loss is seen. No other soft tissue abnormality is noted. CT CERVICAL SPINE FINDINGS Seven cervical segments are well visualized. Vertebral body height is well maintained. Multilevel osteophytic changes are seen. Facet hypertrophic changes are noted as well. No findings to suggest acute fracture or acute facet abnormality are seen. Visualized soft tissues are within normal limits. Mild carotid calcifications are seen. The lung apices are unremarkable as visualized. IMPRESSION: CT of the head: Changes consistent with mild subarachnoid hemorrhage along the vertex bilaterally. Prior right frontoparietal infarct. CT of the maxillofacial bones: No acute fracture is noted. There appears to be an acutely missing tooth involving the maxillary central incisor on the right. Clinical correlation is recommended. CT of the cervical spine: Multilevel degenerative change without acute abnormality. Critical Value/emergent results were called by telephone at the time of interpretation on 05/06/2015 at 3:38 pm to Dr. Rhina Brackett, who verbally acknowledged these results. Electronically Signed   By: Inez Catalina M.D.   On: 05/06/2015 15:41    Assessment and plan:   Mariah Arias is an 82  y.o. female patient admitted with altered mental status, history of seizures on Keppra 1500 mg twice a day now. A small vertex arachnoid hemorrhage noted which is likely to be inconsequential. EEG showed evidence of encephalopathy, no seizures. Continue same dose of Keppra 1500 mg twice a day. Been treated with antibiotics for UTI, follow-up blood cultures and urine cultures. She is hypothermic and is on bear hugger.  Her clinical presentation is consistent with toxic metabolic encephalopathy secondary to UTI.  May switch her IV Keppra to by mouth Keppra same dose if her mental status improves in the next few days and she is able to tolerate by mouth pills.  No further neurodiagnostic testing  recommended at this point.  Neurology service will sign off. Please call for any further questions.

## 2015-05-07 NOTE — Procedures (Addendum)
Continuous Video-EEG Monitoring Report  Patient: Mariah Arias, [place]     EEG No.ID: [id number]     DOB: 12/09/1924 Age: 82 Room#[id number]  MED REC NO: [id number] Gender: Female   TECH: Hinda Kehr     Physician: Winfield Cunas     Referring Physician: Fenton Malling     Report Date: 05/07/2015      Study Duration: 05/06/2015 19:38 to 05/07/2015 08:47 CPT Code:  [id number] Diagnosis:  R41.82  History: This is an 82 year old female presenting with altered mental status.  Video-EEG monitoring was performed to evaluate for seizures.  Technical Details:  Long-term video-EEG monitoring was performed using standard setting per the guidelines.  Briefly, a minimum of 21 electrodes were placed on scalp according to the International 10-20 or/and 10-10 Systems.  Supplemental electrodes were placed as needed.  Single EKG electrode was also used to detect cardiac arrhythmia.  Patient's behavior was continuously recorded on video simultaneously with EEG.  A minimum of 16 channels were used for data display.  Each epoch of study was reviewed manually daily and as needed using standard referential and bipolar montages.    EEG Description:  There was generalized polymorphic delta and theta slowing.  No posterior dominant rhythm or sleep architecture was present.  No epileptiform discharges or seizures were in evidence.  Impression:  This is an abnormal EEG due to the presence of generalized polymorphic delta and theta slowing, suggesting moderate to severe encephalopathy.     Reading Physician: Winfield Cunas, MD, PhD

## 2015-05-08 DIAGNOSIS — R6889 Other general symptoms and signs: Secondary | ICD-10-CM | POA: Diagnosis present

## 2015-05-08 DIAGNOSIS — N390 Urinary tract infection, site not specified: Secondary | ICD-10-CM

## 2015-05-08 DIAGNOSIS — B9689 Other specified bacterial agents as the cause of diseases classified elsewhere: Secondary | ICD-10-CM

## 2015-05-08 DIAGNOSIS — E162 Hypoglycemia, unspecified: Secondary | ICD-10-CM | POA: Diagnosis present

## 2015-05-08 DIAGNOSIS — E2740 Unspecified adrenocortical insufficiency: Secondary | ICD-10-CM

## 2015-05-08 LAB — BASIC METABOLIC PANEL
Anion gap: 9 (ref 5–15)
BUN: 10 mg/dL (ref 6–20)
CO2: 22 mmol/L (ref 22–32)
CREATININE: 1.21 mg/dL — AB (ref 0.44–1.00)
Calcium: 8.4 mg/dL — ABNORMAL LOW (ref 8.9–10.3)
Chloride: 114 mmol/L — ABNORMAL HIGH (ref 101–111)
GFR calc Af Amer: 45 mL/min — ABNORMAL LOW (ref >60)
GFR, EST NON AFRICAN AMERICAN: 39 mL/min — AB (ref >60)
GLUCOSE: 108 mg/dL — AB (ref 65–99)
Potassium: 3.9 mmol/L (ref 3.5–5.1)
SODIUM: 145 mmol/L (ref 135–145)

## 2015-05-08 LAB — CBC
HCT: 31.4 % — ABNORMAL LOW (ref 36.0–46.0)
Hemoglobin: 10.4 g/dL — ABNORMAL LOW (ref 12.0–15.0)
MCH: 28.7 pg (ref 26.0–34.0)
MCHC: 33.1 g/dL (ref 30.0–36.0)
MCV: 86.5 fL (ref 78.0–100.0)
PLATELETS: 133 10*3/uL — AB (ref 150–400)
RBC: 3.63 MIL/uL — ABNORMAL LOW (ref 3.87–5.11)
RDW: 15.2 % (ref 11.5–15.5)
WBC: 3.6 10*3/uL — AB (ref 4.0–10.5)

## 2015-05-08 LAB — CORTISOL-AM, BLOOD: CORTISOL - AM: 5.1 ug/dL — AB (ref 6.7–22.6)

## 2015-05-08 LAB — GLUCOSE, CAPILLARY
GLUCOSE-CAPILLARY: 103 mg/dL — AB (ref 65–99)
GLUCOSE-CAPILLARY: 111 mg/dL — AB (ref 65–99)
GLUCOSE-CAPILLARY: 116 mg/dL — AB (ref 65–99)
Glucose-Capillary: 112 mg/dL — ABNORMAL HIGH (ref 65–99)
Glucose-Capillary: 115 mg/dL — ABNORMAL HIGH (ref 65–99)

## 2015-05-08 LAB — URINE CULTURE
CULTURE: NO GROWTH
CULTURE: NO GROWTH
SPECIAL REQUESTS: NORMAL

## 2015-05-08 MED ORDER — HYDROCORTISONE NA SUCCINATE PF 100 MG IJ SOLR
50.0000 mg | Freq: Four times a day (QID) | INTRAMUSCULAR | Status: DC
  Administered 2015-05-08 – 2015-05-09 (×4): 50 mg via INTRAVENOUS
  Filled 2015-05-08 (×4): qty 2

## 2015-05-08 NOTE — Progress Notes (Signed)
   Subjective: Patient still somnolent this morning, but is more interactive. She can tell me her name and that she was born in [birth year]. She says she is sleepy. She minimally follows commands. She attempts to squeeze my fingers.   Objective: Vital signs in last 24 hours: Filed Vitals:   05/08/15 0200 05/08/15 0300 05/08/15 0330 05/08/15 0735  BP: 101/58  97/53 105/57  Pulse: 57  56 52  Temp: 98.2 F (36.8 C)  97.7 F (36.5 C) 96.6 F (35.9 C)  TempSrc:  Core (Comment)  Core (Comment)  Resp: 11  21 17   Height:      Weight:      SpO2: 100%  100% 99%   Weight change:   Intake/Output Summary (Last 24 hours) at 05/08/15 1116 Last data filed at 05/08/15 0537  Gross per 24 hour  Intake 2598.34 ml  Output      0 ml  Net 2598.34 ml   General: resting in bed, somnolent, warming blanket in place Cardiac: distant heart sounds, brady with regular rhythm. 2/6 systolic murmur heard best at the LUS border Pulm: clear to auscultation anteriorly Abd: soft, nontender, nondistended Ext: no pedal edema, moves all extremities spontaneously Neuro: lethargic, occasionally attempts to follow commands, moves extremities spontaneously  Assessment/Plan: Active Problems:   SAH (subarachnoid hemorrhage) (HCC)   Subarachnoid bleed (HCC)   Complicated UTI (urinary tract infection)   Seizure disorder as sequela of cerebrovascular accident ([place])   Acute encephalopathy  UTI: Patient with history of seizures and apparent fall and altered mental status. It is unclear if fall or AMS was present first. Per family she is talkative at her baseline and her current confusion and "out of it" appearance is consistent with prior seizures. She likely has a UTI which lowered her seizure threshold with resultant fall. Vancomycin added yesterday to Ceftriaxone for empiric coverage of meningitis -IV Ceftriaxone 1g q24h -IV Vancomycin per pharmacy. -de-escalate abx as indicated -Telemetry -f/u urine and blood  cultures -Monitor mental status  Seizure disorder: Per neurology -Keppra per Neurology >> advised can switch to PO same dose if mental status improves -Overnight continuous EEG >> encephalopathy, no electrographic seizures  Adrenal insufficiency: Patient with continued hypotension and hypothermia. She did have low-grade fever yesterday, likely related to warming blanket. AM Cortisol this morning is low at 5.1. -IV Solu-cortef 50 mg q6h   AKI: SCr slightly elevated at 1.21, down from 1.36. Baseline appears to be around 0.80-0.90 -D5-1/2NS@ 100 mL/hr   Dispo: Disposition is deferred at this time, awaiting improvement of current medical problems.  Anticipated discharge in approximately 2-3 day(s).      LOS: 2 days   Zada Finders, MD 05/08/2015, 11:16 AM

## 2015-05-08 NOTE — Evaluation (Signed)
Clinical/Bedside Swallow Evaluation Patient Details  Name: Mariah Arias MRN: [id number] Date of Birth: [birth year]  Today's Date: 05/08/2015 Time: SLP Start Time (ACUTE ONLY): 0846 SLP Stop Time (ACUTE ONLY): 0858 SLP Time Calculation (min) (ACUTE ONLY): 12 min  Past Medical History:  Past Medical History  Diagnosis Date  . Complicated UTI (urinary tract infection) 05/07/2015  . Seizure disorder as sequela of cerebrovascular accident ([place]) 05/07/2015   Past Surgical History: No past surgical history on file. HPI:  82 y.o. female with h/o seizures on Keppra, hemorrhage stroke (2013), recurrent UTIs, who presented to ED after fall and AMS. CT Head 3/18 changes consistent with mild subarachnoid hemorrhage along vertex bilaterally. CXR 3/18 no acute abnormality seen. No prior h/o SLP intervention found in chart.   Assessment / Plan / Recommendation Clinical Impression  Pt asleep upon SLP arrival and difficult to arouse. Pt kept eyes closed throughout assessment with minimal vocalizations. Poor awareness of bolus resulting in prolonged oral transit observed during intake of ice chips. Suspect delayed swallow initiation and decreased hyolaryngeal excursion with no overt s/s of penetration/aspiration, however pt is at severe aspiration risk due to lethargy and AMS. RN notified of recommended continued NPO status. SLP will f/u to determine PO readiness.    Aspiration Risk  Severe aspiration risk    Diet Recommendation NPO   Medication Administration: Via alternative means    Other  Recommendations Oral Care Recommendations: Oral care QID   Follow up Recommendations   (TBD)    Frequency and Duration min 2x/week  2 weeks       Prognosis Prognosis for Safe Diet Advancement: Fair Barriers to Reach Goals: Severity of deficits      Swallow Study   General HPI: 82 y.o. female with h/o seizures on Keppra, hemorrhage stroke (2013), recurrent UTIs, who presented to ED after fall  and AMS. CT Head 3/18 changes consistent with mild subarachnoid hemorrhage along vertex bilaterally. CXR 3/18 no acute abnormality seen. No prior h/o SLP intervention found in chart. Type of Study: Bedside Swallow Evaluation Previous Swallow Assessment: see HPI Diet Prior to this Study: NPO Temperature Spikes Noted: Yes Respiratory Status: Room air History of Recent Intubation: No Behavior/Cognition: Lethargic/Drowsy;Requires cueing Oral Cavity Assessment: Edema Oral Care Completed by SLP: Yes Oral Cavity - Dentition: Adequate natural dentition Vision:  (unable to assess) Self-Feeding Abilities: Total assist Patient Positioning: Upright in bed Baseline Vocal Quality: Low vocal intensity;Hoarse Volitional Cough: Cognitively unable to elicit Volitional Swallow: Unable to elicit    Oral/Motor/Sensory Function Overall Oral Motor/Sensory Function: Generalized oral weakness (unable to assess due to lethargy)   Ice Chips Ice chips: Impaired Presentation: Spoon Oral Phase Impairments: Poor awareness of bolus Oral Phase Functional Implications: Prolonged oral transit Pharyngeal Phase Impairments: Suspected delayed Swallow;Decreased hyoid-laryngeal movement   Thin Liquid Thin Liquid: Not tested    Nectar Thick Nectar Thick Liquid: Not tested   Honey Thick Honey Thick Liquid: Not tested   Puree Puree: Not tested   Solid   GO   Solid: Not tested        Tajah Schreiner 05/08/2015,10:20 AM   Titus Mould, Student-SLP

## 2015-05-08 NOTE — Progress Notes (Signed)
Patient ID: Mariah Arias, female   DOB: 29-Jul-1924, 82 y.o.   MRN: [id number] Medicine attending: I examined this patient this morning together with resident physician Dr. Zada Finders and I concur with his evaluation and management plan which we discussed together. Patient is much more alert today. She opens her eyes on commands. She is aware she is in the hospital. She was able to verbalize that her birthday is in [birth year]. Core temperature has come back up into normal range. One isolated temperature of 96.6. Blood glucose normal. Renal function stable. Results of blood and urine cultures still outstanding. EEG findings non-specific. No seizure focus. More consistent with encephalopathy. Cortisol level suppressed may be from sepsis. We will continue current broad-spectrum parenteral antibiotics for presumed urosepsis.

## 2015-05-08 NOTE — Progress Notes (Signed)
Md notified of pt's glucose this am of 87.  Pt  Has been NPO and is still NPO.  New orders received.  Will check CBG's every 4 hours and place pt on D5 1/2 NS at 100.  Will continue to monitor Saunders Revel T

## 2015-05-09 DIAGNOSIS — D61818 Other pancytopenia: Secondary | ICD-10-CM

## 2015-05-09 DIAGNOSIS — I609 Nontraumatic subarachnoid hemorrhage, unspecified: Secondary | ICD-10-CM

## 2015-05-09 DIAGNOSIS — G9340 Encephalopathy, unspecified: Secondary | ICD-10-CM

## 2015-05-09 DIAGNOSIS — D619 Aplastic anemia, unspecified: Secondary | ICD-10-CM | POA: Insufficient documentation

## 2015-05-09 LAB — BASIC METABOLIC PANEL
Anion gap: 9 (ref 5–15)
BUN: 8 mg/dL (ref 6–20)
CHLORIDE: 113 mmol/L — AB (ref 101–111)
CO2: 23 mmol/L (ref 22–32)
Calcium: 8.8 mg/dL — ABNORMAL LOW (ref 8.9–10.3)
Creatinine, Ser: 1.02 mg/dL — ABNORMAL HIGH (ref 0.44–1.00)
GFR calc Af Amer: 55 mL/min — ABNORMAL LOW (ref >60)
GFR calc non Af Amer: 48 mL/min — ABNORMAL LOW (ref >60)
GLUCOSE: 122 mg/dL — AB (ref 65–99)
POTASSIUM: 3.8 mmol/L (ref 3.5–5.1)
Sodium: 145 mmol/L (ref 135–145)

## 2015-05-09 LAB — GLUCOSE, CAPILLARY
GLUCOSE-CAPILLARY: 85 mg/dL (ref 65–99)
GLUCOSE-CAPILLARY: 103 mg/dL — AB (ref 65–99)
Glucose-Capillary: 132 mg/dL — ABNORMAL HIGH (ref 65–99)
Glucose-Capillary: 138 mg/dL — ABNORMAL HIGH (ref 65–99)

## 2015-05-09 MED ORDER — DOCUSATE SODIUM 100 MG PO CAPS: 100.0000 mg | ORAL_CAPSULE | Freq: Two times a day (BID) | ORAL | Status: DC | PRN

## 2015-05-09 MED ORDER — COSYNTROPIN 0.25 MG IJ SOLR
0.2500 mg | Freq: Once | INTRAMUSCULAR | Status: AC
  Administered 2015-05-10: 0.25 mg via INTRAVENOUS
  Filled 2015-05-09 (×2): qty 0.25

## 2015-05-09 MED ORDER — LEVETIRACETAM 750 MG PO TABS
1500.0000 mg | ORAL_TABLET | Freq: Two times a day (BID) | ORAL | Status: DC
  Administered 2015-05-09 – 2015-05-12 (×7): 1500 mg via ORAL
  Filled 2015-05-09 (×7): qty 2

## 2015-05-09 MED ORDER — PREDNISONE 20 MG PO TABS
40.0000 mg | ORAL_TABLET | Freq: Every day | ORAL | Status: AC
  Administered 2015-05-09: 40 mg via ORAL
  Filled 2015-05-09: qty 2

## 2015-05-09 MED ORDER — PREDNISONE 20 MG PO TABS: 20.0000 mg | ORAL_TABLET | Freq: Every day | ORAL | Status: DC

## 2015-05-09 MED ORDER — PREDNISONE 10 MG PO TABS: 10.0000 mg | ORAL_TABLET | Freq: Every day | ORAL | Status: DC

## 2015-05-09 NOTE — Progress Notes (Signed)
Speech Language Pathology Treatment: Dysphagia  Patient Details Name: Mariah Arias MRN: [id number] DOB: September 10, 1924 Today's Date: 05/09/2015 Time: 5105-0403 SLP Time Calculation (min) (ACUTE ONLY): 18 min  Assessment / Plan / Recommendation Clinical Impression  Pt easily arousable, needed mod facing to min verbal and tactile cues to increase awareness for self feeding, then fading to independence. Pt able to swallow multiple sips of thin liquids with slight delay in swallowing, but no signs of aspiration observed. Pt able to self feed and masticate cracker automatically. Pt may need some assistance attending to meals and utilizing utensils. Recommend intermittent supervision for set up and initiating meal; regular diet and thin liquids. No SLP f/u needed for swallowing, though cognitive function remains impaired.  Will f/u with cognitive linguistic assessment and recommend CIR at d/c.    HPI HPI: 82 y.o. female with h/o seizures on Keppra, hemorrhage stroke (2013), recurrent UTIs, who presented to ED after fall and AMS. CT Head 3/18 changes consistent with mild subarachnoid hemorrhage along vertex bilaterally. CXR 3/18 no acute abnormality seen. No prior h/o SLP intervention found in chart.      SLP Plan  Discharge SLP treatment due to (comment);All goals met     Recommendations  Diet recommendations: Regular;Thin liquid Liquids provided via: Cup;Straw Medication Administration: Whole meds with liquid Supervision: Intermittent supervision to cue for compensatory strategies Compensations: Minimize environmental distractions;Slow rate;Small sips/bites Postural Changes and/or Swallow Maneuvers: Seated upright 90 degrees             Oral Care Recommendations: Oral care BID Follow up Recommendations: 24 hour supervision/assistance/CIR Plan: Discharge SLP treatment due to (comment);All goals met     GO                Mariah Arias, Katherene Ponto 05/09/2015, 9:21 AM

## 2015-05-09 NOTE — Progress Notes (Signed)
Dr. Posey Pronto notified of pt's temp 93.6. NAD. Orders received.

## 2015-05-09 NOTE — Evaluation (Signed)
Speech Language Pathology Evaluation Patient Details Name: Glennys Shirah MRN: [id number] DOB: 05/04/1924 Today's Date: 05/09/2015 Time: [id number] SLP Time Calculation (min) (ACUTE ONLY): 29 min  Problem List:  Patient Active Problem List   Diagnosis Date Noted  . Hypoglycemia, unspecified   . Body temperature low   . Complicated UTI (urinary tract infection) 05/07/2015  . Seizure disorder as sequela of cerebrovascular accident ([place]) 05/07/2015  . Acute encephalopathy   . SAH (subarachnoid hemorrhage) ([place]) 05/06/2015  . Subarachnoid bleed ([place]) 05/06/2015   Past Medical History:  Past Medical History  Diagnosis Date  . Complicated UTI (urinary tract infection) 05/07/2015  . Seizure disorder as sequela of cerebrovascular accident ([place]) 05/07/2015   Past Surgical History: No past surgical history on file. HPI:  82 y.o. female with h/o seizures on Keppra, hemorrhage stroke (2013), recurrent UTIs, who presented to ED after fall and AMS. CT Head 3/18 changes consistent with mild subarachnoid hemorrhage along vertex bilaterally. CXR 3/18 no acute abnormality seen. No prior h/o SLP intervention found in chart.   Assessment / Plan / Recommendation Clinical Impression  Pt demonstrates moderate to severe cognitive impairment. Pt is able to intermittently attend to tasks and speakers, but requires moderate verbal cues to particpate in verbal tasks. She demonstrates language of confusion with open ended questions and semantic paraphasias and perseveration with confrontational naming. When able to attend, receptive language is intact; pt can follow up to two step commands.  Given impairment with attention, short term memory is also impaired; pt unable to verbalize orientation despite max verbal cues over many trials. She needs moderate verbal and visual cues for basic functional problem solving with self feeding task, but become automatic and independent over time. Unsure of pts baseline,  recommend CIR upon d/c given cognitive linguistic deficits. Will follow acutely to facilitate recovery.     SLP Assessment  Patient needs continued Speech Lanaguage Pathology Services    Follow Up Recommendations  Inpatient Rehab;24 hour supervision/assistance    Frequency and Duration min 2x/week  2 weeks      SLP Evaluation Prior Functioning  Cognitive/Linguistic Baseline: Information not available Type of Home: House  Lives With: Family Available Help at Discharge: Family   Cognition  Overall Cognitive Status: Impaired/Different from baseline Arousal/Alertness: Awake/alert Orientation Level: Oriented to person;Disoriented to place;Disoriented to time;Disoriented to situation Attention: Focused;Sustained Focused Attention: Appears intact Sustained Attention: Impaired Sustained Attention Impairment: Verbal complex;Functional complex Memory: Impaired Memory Impairment: Storage deficit;Decreased short term memory Decreased Short Term Memory: Verbal basic Awareness: Impaired Awareness Impairment: Intellectual impairment Problem Solving: Impaired Problem Solving Impairment: Verbal basic;Functional basic    Comprehension  Auditory Comprehension Overall Auditory Comprehension: Impaired Yes/No Questions: Impaired Basic Biographical Questions: 26-50% accurate Commands: Impaired One Step Basic Commands: 50-74% accurate Two Step Basic Commands: 75-100% accurate Conversation: Simple Interfering Components: Attention;Working Marine scientist Reading Comprehension Reading Status: Not tested    Expression Verbal Expression Overall Verbal Expression: Impaired Initiation: Impaired Automatic Speech: Name;Social Response Level of Generative/Spontaneous Verbalization: Phrase;Word Repetition: Impaired Level of Impairment: Word level Naming: Impairment Responsive: Not tested Confrontation: Impaired Convergent: Not tested Divergent: Not tested Verbal Errors: Language of  confusion;Perseveration;Semantic paraphasias Interfering Components: Attention   Oral / Motor  Oral Motor/Sensory Function Overall Oral Motor/Sensory Function: Within functional limits Motor Speech Overall Motor Speech: Appears within functional limits for tasks assessed   GO                   Herbie Baltimore, MA CCC-SLP [id number]  Mitsuye Schrodt, Katherene Ponto  05/09/2015, 10:53 AM

## 2015-05-09 NOTE — Progress Notes (Addendum)
   Subjective: Patient more alert and interactive this morning. She tells [place] that she was born in [birth year]. She thinks she is in [place]. She does follow more commands today. She has passed her swallow evaluation this morning and reportedly feeding herself. She is pleasant and laughing today during examination.  Objective: Vital signs in last 24 hours: Filed Vitals:   05/08/15 1214 05/08/15 1508 05/08/15 2229 05/09/15 0700  BP: 99/49 108/56 110/59 111/61  Pulse: 56 71 68 77  Temp: 96.8 F (36 C) 97 F (36.1 C) 98 F (36.7 C) 98 F (36.7 C)  TempSrc: Core (Comment) Oral Oral Oral  Resp: 15 17 17 19   Height:      Weight:  169 lb (76.658 kg)    SpO2: 99% 100% 10% 100%   Weight change:   Intake/Output Summary (Last 24 hours) at 05/09/15 1146 Last data filed at 05/09/15 [id number]  Gross per 24 hour  Intake 1616.67 ml  Output   1075 ml  Net 541.67 ml   General: resting in bed, interactive, pleasant Cardiac: distant heart sounds, RRR Pulm: limited as patient not taking deep breaths, clear breath sounds Abd: soft, nontender, nondistended Ext: no pedal edema, moves all extremities Neuro: pleasant, interactive, oriented to person but not place even after told she is in [place], squeezes fingers and follows more commands than prior  Assessment/Plan: Active Problems:   SAH (subarachnoid hemorrhage) (H[facility])   Subarachnoid bleed (H[facility])   Complicated UTI (urinary tract infection)   Seizure disorder as sequela of cerebrovascular accident ([place])   Acute encephalopathy   Hypoglycemia, unspecified   Body temperature low  UTI: Patient with history of seizures and apparent fall and altered mental status. It is unclear if fall or AMS was present first. Per family she is talkative at her baseline and her current confusion and "out of it" appearance is consistent with prior seizures. She has history of recurrent UTIs and on prophylactic Macrobid at home. She likely has a UTI which lowered her  seizure threshold with resultant fall. Cultures are negative to date. Will continue IV Ceftriaxone and discontinue Vancomycin. -IV Ceftriaxone 1g q24h for 5 total days -d/c IV Vancomycin -f/u urine and blood cultures -Monitor mental status -SLP recommending CIR -PT/OT eval  Seizure disorder: Per neurology -Keppra per Neurology >> switching IV to PO same dose today 1500 mg BID -Overnight continuous EEG >> encephalopathy, no electrographic seizures   Relative Adrenal insufficiency: Likely related to urinary infection. Started on IV Solu-cortef yesterday. Her temperature and heart rate have normalized and blood pressures improving. AM Cortisol was low at 5.1 on 3/20. Will transition to oral steroid taper. -d/c IV Solu-cortef 50 mg q6h -Prednisone 40 today, then 20, 10, 10 over next 3 days   AKI: Resolving, SCr improved to 1.02 compared to 1.36 on admission. Baseline appears to be around 0.80-0.90 -oral hydration   Dispo: Disposition is deferred at this time, awaiting improvement of current medical problems.  Anticipated discharge in approximately 1-2 day(s). May benefit from inpatient rehab.     LOS: 3 days   Zada Finders, MD 05/09/2015, 11:46 AM

## 2015-05-09 NOTE — Progress Notes (Signed)
Patient ID: Mariah Arias, female   DOB: [birth year], 82 y.o.   MRN: [id number] Medicine attending: I examined this patient this morning together with resident physician Dr. Zada Finders and I concur with his evaluation and management plan which we discussed together. We were pleasantly surprised when we walked in the rooms morning and found the patient sitting up in bed wide awake and alert. She is oriented to person but not place or time. She knows the month but not the day of her birth. Short-term memory is very poor. We had to reinforce to her multiple times that she was at [facility] in [facility]. She is able to cooperate better with exam. No focal deficits other than her mental status. Rectal temperature has fallen again. Sodium is upper normal at 145. BUN and creatinine now normal. She has developed mild pancytopenia.  She remains on Rocephin for urinary tract infection and Keppra for presumed seizure. Blood and urine culture showing no growth. I think we can safely stop the Rocephin at this time. It may be contributing to the fall in her counts. She was put on steroids in view of low baseline cortisol level but I would like to stop these and do an ACTH stimulation test in view of the persistent hypothermia and negative cultures.

## 2015-05-09 NOTE — Progress Notes (Signed)
Physical medicine rehabilitation consult requested chart reviewed. Patient presented with traumatic subarachnoid hemorrhage. Patient not able to per dissipate with exam at this time. Physical and occupational therapy evaluations are pending. Plan to await therapy evaluations to be completed follow-up with appropriate recommendations at that time. Need to confirm if Group Home can provide physical assistance to patient on discharge.

## 2015-05-09 NOTE — Care Management Important Message (Signed)
Important Message  Patient Details  Name: Mariah Arias MRN: [id number] Date of Birth: [birth year]   Medicare Important Message Given:  Yes    [name] 05/09/2015, 12:06 PM

## 2015-05-09 NOTE — Progress Notes (Signed)
OT Cancellation Note  Patient Details Name: Mariah Arias MRN: [id number] DOB: 09-May-1924   Cancelled Treatment:    Reason Eval/Treat Not Completed: Patient not medically ready. As of 2:32 pm pt's temp was 93.6 and they are trying to use warm blankets to raise her temperature. Spoke with nurse and we will wait until tomorrow to evaluate as appropriate.  Almon Register [id number] 05/09/2015, 3:22 PM

## 2015-05-09 NOTE — Progress Notes (Signed)
No Bear Hugger available at this time. Will use warm blankets.

## 2015-05-09 NOTE — Consult Note (Signed)
Physical Medicine and Rehabilitation Consult Reason for Consult: Traumatic subarachnoid hemorrhage Referring Physician: Family medicine   HPI: Mariah Arias is a 82 y.o. right handed female with history of advanced dementia, adrenal insufficiency, hemorrhagic stroke 2013 with seizure disorder and maintained on Keppra. By chart review patient lives an adult group home and used a walker prior to admission. She presented 05/06/2015 after being found on the floor by caregiver after exiting from her bathroom with altered mental status. CT of the head consistent with mild subarachnoid hemorrhage. Prior right frontoparietal infarct. CT cervical spine negative. EEG generalized polymorphic delta and theta slowing suggesting moderate to severe encephalopathy. No seizure activity. Urine culture no growth. Neurosurgery consulted and advised conservative care. Tolerating a regular diet. Physical and occupational therapy evaluations completed 05/10/2015 with recommendations of physical medicine rehabilitation consult.   Review of Systems  Unable to perform ROS: mental acuity   Past Medical History  Diagnosis Date  . Complicated UTI (urinary tract infection) 05/07/2015  . Seizure disorder as sequela of cerebrovascular accident ([place]) 05/07/2015   No past surgical history on file. No family history on file. Social History:  has no tobacco, alcohol, and drug history on file. Allergies: No Known Allergies Medications Prior to Admission  Medication Sig Dispense Refill  . docusate sodium (COLACE) 100 MG capsule Take 100 mg by mouth 2 (two) times daily as needed for mild constipation.    . levETIRAcetam (KEPPRA) 1000 MG tablet Take 1,000 mg by mouth 2 (two) times daily.    . nitrofurantoin, macrocrystal-monohydrate, (MACROBID) 100 MG capsule Take 100 mg by mouth daily.      Home: Home Living Available Help at Discharge: Family Type of Home: House  Lives With: Family  Functional  History:   Functional Status:  Mobility:          ADL:    Cognition: Cognition Overall Cognitive Status: Impaired/Different from baseline Arousal/Alertness: Awake/alert Orientation Level: Oriented to person, Disoriented to place, Disoriented to time, Disoriented to situation Attention: Focused, Sustained Focused Attention: Appears intact Sustained Attention: Impaired Sustained Attention Impairment: Verbal complex, Functional complex Memory: Impaired Memory Impairment: Storage deficit, Decreased short term memory Decreased Short Term Memory: Verbal basic Awareness: Impaired Awareness Impairment: Intellectual impairment Problem Solving: Impaired Problem Solving Impairment: Verbal basic, Functional basic Cognition Overall Cognitive Status: Impaired/Different from baseline  Blood pressure 111/61, pulse 77, temperature 98 F (36.7 C), temperature source Oral, resp. rate 19, height 5\' 7"  (1.702 m), weight 76.658 kg (169 lb), SpO2 100 %. Physical Exam  Constitutional:  Lying perpendicular in bed with legs hanging off side  HENT:  Head: Normocephalic.  Eyes: EOM are normal.  Neck: Normal range of motion. Neck supple. No thyromegaly present.  Cardiovascular: Normal rate and regular rhythm.   Respiratory: Effort normal and breath sounds normal. No respiratory distress.  GI: Soft. Bowel sounds are normal. She exhibits no distension.  Neurological:  Alert sitting up in bed. Pleasantly confused. Poor medical historian. She cannot name place age or date of birth. Followed minimal simple commands. NO INSIGHT OR AWARENESS.   Skin: Skin is warm and dry.  Psychiatric:  Very confused    Results for orders placed or performed during the hospital encounter of 05/06/15 (from the past 24 hour(s))  Glucose, capillary     Status: Abnormal   Collection Time: 05/08/15 12:15 PM  Result Value Ref Range   Glucose-Capillary 111 (H) 65 - 99 mg/dL   Comment 1 Capillary Specimen   Glucose,  capillary     Status: Abnormal   Collection Time: 05/08/15  6:05 PM  Result Value Ref Range   Glucose-Capillary 116 (H) 65 - 99 mg/dL   Comment 1 Notify RN   Glucose, capillary     Status: Abnormal   Collection Time: 05/08/15 11:40 PM  Result Value Ref Range   Glucose-Capillary 115 (H) 65 - 99 mg/dL   Comment 1 Notify RN   Glucose, capillary     Status: None   Collection Time: 05/09/15  7:02 AM  Result Value Ref Range   Glucose-Capillary 85 65 - 99 mg/dL  Basic metabolic panel     Status: Abnormal   Collection Time: 05/09/15  7:44 AM  Result Value Ref Range   Sodium 145 135 - 145 mmol/L   Potassium 3.8 3.5 - 5.1 mmol/L   Chloride 113 (H) 101 - 111 mmol/L   CO2 23 22 - 32 mmol/L   Glucose, Bld 122 (H) 65 - 99 mg/dL   BUN 8 6 - 20 mg/dL   Creatinine, Ser 1.02 (H) 0.44 - 1.00 mg/dL   Calcium 8.8 (L) 8.9 - 10.3 mg/dL   GFR calc non Af Amer 48 (L) >60 mL/min   GFR calc Af Amer 55 (L) >60 mL/min   Anion gap 9 5 - 15   No results found.  Assessment/Plan: Diagnosis: SAH, dementia 1. Does the need for close, 24 hr/day medical supervision in concert with the patient's rehab needs make it unreasonable for this patient to be served in a less intensive setting? No 2. Co-Morbidities requiring supervision/potential complications:   3. Due to bladder management, bowel management, safety, skin/wound care, disease management, medication administration, pain management and patient education, does the patient require 24 hr/day rehab nursing? No 4. Does the patient require coordinated care of a physician, rehab nurse, n/a to address physical and functional deficits in the context of the above medical diagnosis(es)? No Addressing deficits in the following areas: balance, endurance and locomotion 5. Can the patient actively participate in an intensive therapy program of at least 3 hrs of therapy per day at least 5 days per week? No 6. The potential for patient to make measurable gains while on  inpatient rehab is poor 7. Anticipated functional outcomes upon discharge from inpatient rehab are n/a  with PT, n/a with OT, n/a with SLP. 8. Estimated rehab length of stay to reach the above functional goals is:  n/a 9. Does the patient have adequate social supports and living environment to accommodate these discharge functional goals? No 10. Anticipated D/C setting: Other 11. Anticipated post D/C treatments: N/A 12. Overall Rehab/Functional Prognosis: poor  RECOMMENDATIONS: This patient's condition is appropriate for continued rehabilitative care in the following setting: SNF Patient has agreed to participate in recommended program. No Note that insurance prior authorization may be required for reimbursement for recommended care.  Comment: Pt with severe dementia. Best suited for SNF level rehab.  Meredith Staggers, MD, [facility] 05/11/2015     05/09/2015

## 2015-05-10 DIAGNOSIS — E2740 Unspecified adrenocortical insufficiency: Secondary | ICD-10-CM | POA: Diagnosis not present

## 2015-05-10 LAB — BASIC METABOLIC PANEL
Anion gap: 5 (ref 5–15)
BUN: 10 mg/dL (ref 4–21)
BUN: 10 mg/dL (ref 6–20)
CO2: 24 mmol/L (ref 22–32)
Calcium: 8.9 mg/dL (ref 8.9–10.3)
Chloride: 111 mmol/L (ref 101–111)
Creatinine, Ser: 0.9 mg/dL (ref 0.44–1.00)
Creatinine: 0.9 mg/dL (ref 0.5–1.1)
GFR calc Af Amer: >60 mL/min (ref >60)
GFR, EST NON AFRICAN AMERICAN: 55 mL/min — AB (ref >60)
Glucose, Bld: 97 mg/dL (ref 65–99)
Glucose: 97 mg/dL
POTASSIUM: 3.6 mmol/L (ref 3.5–5.1)
SODIUM: 140 mmol/L (ref 135–145)
Sodium: 140 mmol/L (ref 137–147)

## 2015-05-10 LAB — GLUCOSE, CAPILLARY
GLUCOSE-CAPILLARY: 104 mg/dL — AB (ref 65–99)
Glucose-Capillary: 101 mg/dL — ABNORMAL HIGH (ref 65–99)
Glucose-Capillary: 111 mg/dL — ABNORMAL HIGH (ref 65–99)

## 2015-05-10 LAB — ACTH STIMULATION, 3 TIME POINTS
CORTISOL BASE: 6.2 ug/dL
Cortisol, 30 Min: 6.1 ug/dL

## 2015-05-10 MED ORDER — HYDROCORTISONE 10 MG PO TABS
10.0000 mg | ORAL_TABLET | Freq: Every day | ORAL | Status: DC
  Administered 2015-05-10 – 2015-05-11 (×2): 10 mg via ORAL
  Filled 2015-05-10 (×3): qty 1

## 2015-05-10 MED ORDER — HYDROCORTISONE 20 MG PO TABS: 20.0000 mg | ORAL_TABLET | Freq: Every day | ORAL | Status: DC

## 2015-05-10 NOTE — Progress Notes (Signed)
Patient ID: Mariah Arias, female   DOB: [birth year], 82 y.o.   MRN: [id number] Medicine attending: I examined this patient together with resident physician Dr. Zada Finders and I concur with his evaluation and management plan which we discussed together. No acute clinical change. She remains alert and pleasantly demented. She is eating well. She has no complaints. She remains disoriented to place and time. Body temperature remains sub-normal. She had an ACTH stimulation test this morning. Final results are pending but baseline cortisol again low and 30 minute postinjection cortisol value also low suggesting that she doesn't fact have adrenal insufficiency. This may be central in nature. Dr. Posey Pronto from the report of an MRI of her brain done in May 2016 which showed pituitary enlargement. We have initiated usual replacement doses of hydrocortisone at this time. We will interface with the family but it appears to me that this woman needs to be in a skilled nursing facility or an extended care facility.

## 2015-05-10 NOTE — Progress Notes (Signed)
Pt was sitting up in the chair with the chair alarm on.  Received phone call from NT at 1700 that pt was found sitting on floor. Pt assisted back to bed.  VS stable and pt states that nothing is hurting.  MD notified.  Mariah Arias

## 2015-05-10 NOTE — Discharge Summary (Signed)
Name: Mariah Arias MRN: [id number] DOB: 03/20/1924 82 y.o. PCP: Noe Gens, MD  Date of Admission: 05/06/2015  2:49 PM Date of Discharge: 05/12/2015 Attending Physician: Annia Belt, MD  Discharge Diagnosis: 1.Acute encephalopathy 2/2 UTI/Seizure/Adrenal insufficiency  Active Problems:   SAH (subarachnoid hemorrhage) (HCC)   Subarachnoid bleed (HCC)   Complicated UTI (urinary tract infection)   Seizure disorder as sequela of cerebrovascular accident ([place])   Acute encephalopathy   Hypoglycemia, unspecified   Body temperature low   Anemia due to bone marrow failure ([place])   Adrenal insufficiency (HCC)  Discharge Medications:   Medication List    TAKE these medications        docusate sodium 100 MG capsule  Commonly known as:  COLACE  Take 100 mg by mouth 2 (two) times daily as needed for mild constipation.     hydrocortisone 20 MG tablet  Commonly known as:  CORTEF  Take 1 tablet (20 mg total) by mouth daily. Take at 6 AM daily.     hydrocortisone 10 MG tablet  Commonly known as:  CORTEF  Take 1 tablet (10 mg total) by mouth daily. Take at 4 PM daily (or 4-6 hours before bedtime).     levETIRAcetam 750 MG tablet  Commonly known as:  KEPPRA  Take 2 tablets (1,500 mg total) by mouth 2 (two) times daily.     nitrofurantoin (macrocrystal-monohydrate) 100 MG capsule  Commonly known as:  MACROBID  Take 100 mg by mouth daily.        Disposition and follow-up:   Ms.Brena Shabria Tilles was discharged from [facility] in Stable condition.  At the hospital follow up visit please address:  1.   Adrenal insufficiency: Started hydrocortisone 20 mg in AM and 10 mg in PM. Please assess if patient tolerating and for any side-effects. Consider titrating down to 10 mg AM and 5 mg PM if indicated.  Seizure disorder: Home Keppra dose increased to 1500 mg BID. Please assess for further seizure activity, tolerance of medication  dosing.  Recurrent UTIs: Home Macrobid prophylaxis continued, please assess for urinary symptoms.  Mild Thrombocytopenia: Patient noted to have mild thrombocytopnia, thought secondary to antibiotics.  2.  Labs / imaging needed at time of follow-up: none  3.  Pending labs/ test needing follow-up: CBC  Follow-up Appointments: Follow-up Information    Schedule an appointment as soon as possible for a visit with Noe Gens, MD.   Specialty:  Internal Medicine   Contact information:   [address] [id number] [facility] [place] [address] [phone]       Discharge Instructions: Discharge Instructions    Call MD for:  difficulty breathing, headache or visual disturbances    Complete by:  As directed      Call MD for:  extreme fatigue    Complete by:  As directed      Call MD for:  persistant dizziness or light-headedness    Complete by:  As directed      Call MD for:  persistant nausea and vomiting    Complete by:  As directed      Call MD for:  severe uncontrolled pain    Complete by:  As directed      Call MD for:  temperature >100.4    Complete by:  As directed      Diet - low sodium heart healthy    Complete by:  As directed  Increase activity slowly    Complete by:  As directed            Consultations:    Procedures Performed:  Ct Head Wo Contrast  05/06/2015  CLINICAL DATA:  Right facial swelling, un known history, likely fall EXAM: CT HEAD WITHOUT CONTRAST CT MAXILLOFACIAL WITHOUT CONTRAST CT CERVICAL SPINE WITHOUT CONTRAST TECHNIQUE: Multidetector CT imaging of the head, cervical spine, and maxillofacial structures were performed using the standard protocol without intravenous contrast. Multiplanar CT image reconstructions of the cervical spine and maxillofacial structures were also generated. COMPARISON:  None. FINDINGS: CT HEAD FINDINGS Bony calvarium is intact.  No gross soft tissue abnormality is seen. Mild atrophic changes are seen.  Areas of prior ischemia are noted in the right frontoparietal region. There are tiny areas of subarachnoid hemorrhage identified near the vertex bilaterally No ventricular component is noted. No acute infarct is noted. CT MAXILLOFACIAL FINDINGS The bony structures of face appear intact without evidence of acute fracture. The paranasal sinuses are within normal limits. The orbits and their contents are unremarkable. Mild soft tissue swelling is noted over the right cheek. The central incisor on the right is not present and appears to be of an acute nature. Correlation with the physical exam is recommended. Dental caries are seen. No other definitive acute tooth loss is seen. No other soft tissue abnormality is noted. CT CERVICAL SPINE FINDINGS Seven cervical segments are well visualized. Vertebral body height is well maintained. Multilevel osteophytic changes are seen. Facet hypertrophic changes are noted as well. No findings to suggest acute fracture or acute facet abnormality are seen. Visualized soft tissues are within normal limits. Mild carotid calcifications are seen. The lung apices are unremarkable as visualized. IMPRESSION: CT of the head: Changes consistent with mild subarachnoid hemorrhage along the vertex bilaterally. Prior right frontoparietal infarct. CT of the maxillofacial bones: No acute fracture is noted. There appears to be an acutely missing tooth involving the maxillary central incisor on the right. Clinical correlation is recommended. CT of the cervical spine: Multilevel degenerative change without acute abnormality. Critical Value/emergent results were called by telephone at the time of interpretation on 05/06/2015 at 3:38 pm to Dr. Rhina Brackett, who verbally acknowledged these results. Electronically Signed   By: Inez Catalina M.D.   On: 05/06/2015 15:41   Ct Cervical Spine Wo Contrast  05/06/2015  CLINICAL DATA:  Right facial swelling, un known history, likely fall EXAM: CT HEAD WITHOUT CONTRAST  CT MAXILLOFACIAL WITHOUT CONTRAST CT CERVICAL SPINE WITHOUT CONTRAST TECHNIQUE: Multidetector CT imaging of the head, cervical spine, and maxillofacial structures were performed using the standard protocol without intravenous contrast. Multiplanar CT image reconstructions of the cervical spine and maxillofacial structures were also generated. COMPARISON:  None. FINDINGS: CT HEAD FINDINGS Bony calvarium is intact.  No gross soft tissue abnormality is seen. Mild atrophic changes are seen. Areas of prior ischemia are noted in the right frontoparietal region. There are tiny areas of subarachnoid hemorrhage identified near the vertex bilaterally No ventricular component is noted. No acute infarct is noted. CT MAXILLOFACIAL FINDINGS The bony structures of face appear intact without evidence of acute fracture. The paranasal sinuses are within normal limits. The orbits and their contents are unremarkable. Mild soft tissue swelling is noted over the right cheek. The central incisor on the right is not present and appears to be of an acute nature. Correlation with the physical exam is recommended. Dental caries are seen. No other definitive acute tooth loss is seen. No other soft  tissue abnormality is noted. CT CERVICAL SPINE FINDINGS Seven cervical segments are well visualized. Vertebral body height is well maintained. Multilevel osteophytic changes are seen. Facet hypertrophic changes are noted as well. No findings to suggest acute fracture or acute facet abnormality are seen. Visualized soft tissues are within normal limits. Mild carotid calcifications are seen. The lung apices are unremarkable as visualized. IMPRESSION: CT of the head: Changes consistent with mild subarachnoid hemorrhage along the vertex bilaterally. Prior right frontoparietal infarct. CT of the maxillofacial bones: No acute fracture is noted. There appears to be an acutely missing tooth involving the maxillary central incisor on the right. Clinical  correlation is recommended. CT of the cervical spine: Multilevel degenerative change without acute abnormality. Critical Value/emergent results were called by telephone at the time of interpretation on 05/06/2015 at 3:38 pm to Dr. Rhina Brackett, who verbally acknowledged these results. Electronically Signed   By: Inez Catalina M.D.   On: 05/06/2015 15:41   Dg Chest Portable 1 View  05/06/2015  CLINICAL DATA:  Recent fall, initial encounter EXAM: PORTABLE CHEST 1 VIEW COMPARISON:  None. FINDINGS: Cardiac shadow is within normal limits. The lungs are well aerated bilaterally. No pneumothorax or effusion is seen. No acute bony abnormality is noted. IMPRESSION: No acute abnormality seen. Electronically Signed   By: Inez Catalina M.D.   On: 05/06/2015 15:44   Dg Hand Complete Left  05/06/2015  CLINICAL DATA:  Recent fall.  Hand swelling. EXAM: LEFT HAND - COMPLETE 3+ VIEW COMPARISON:  None FINDINGS: Pulse oximeter on the tip of the index finger. Negative for a fracture or dislocation. Alignment of the hand is normal. Soft tissues are unremarkable. Mild joint space narrowing in the interpharyngeal joints. IMPRESSION: No acute abnormality to the left hand. Electronically Signed   By: Markus Daft M.D.   On: 05/06/2015 15:44   Ct Maxillofacial Wo Cm  05/06/2015  CLINICAL DATA:  Right facial swelling, un known history, likely fall EXAM: CT HEAD WITHOUT CONTRAST CT MAXILLOFACIAL WITHOUT CONTRAST CT CERVICAL SPINE WITHOUT CONTRAST TECHNIQUE: Multidetector CT imaging of the head, cervical spine, and maxillofacial structures were performed using the standard protocol without intravenous contrast. Multiplanar CT image reconstructions of the cervical spine and maxillofacial structures were also generated. COMPARISON:  None. FINDINGS: CT HEAD FINDINGS Bony calvarium is intact.  No gross soft tissue abnormality is seen. Mild atrophic changes are seen. Areas of prior ischemia are noted in the right frontoparietal region. There are tiny  areas of subarachnoid hemorrhage identified near the vertex bilaterally No ventricular component is noted. No acute infarct is noted. CT MAXILLOFACIAL FINDINGS The bony structures of face appear intact without evidence of acute fracture. The paranasal sinuses are within normal limits. The orbits and their contents are unremarkable. Mild soft tissue swelling is noted over the right cheek. The central incisor on the right is not present and appears to be of an acute nature. Correlation with the physical exam is recommended. Dental caries are seen. No other definitive acute tooth loss is seen. No other soft tissue abnormality is noted. CT CERVICAL SPINE FINDINGS Seven cervical segments are well visualized. Vertebral body height is well maintained. Multilevel osteophytic changes are seen. Facet hypertrophic changes are noted as well. No findings to suggest acute fracture or acute facet abnormality are seen. Visualized soft tissues are within normal limits. Mild carotid calcifications are seen. The lung apices are unremarkable as visualized. IMPRESSION: CT of the head: Changes consistent with mild subarachnoid hemorrhage along the vertex bilaterally. Prior right  frontoparietal infarct. CT of the maxillofacial bones: No acute fracture is noted. There appears to be an acutely missing tooth involving the maxillary central incisor on the right. Clinical correlation is recommended. CT of the cervical spine: Multilevel degenerative change without acute abnormality. Critical Value/emergent results were called by telephone at the time of interpretation on 05/06/2015 at 3:38 pm to Dr. Rhina Brackett, who verbally acknowledged these results. Electronically Signed   By: Inez Catalina M.D.   On: 05/06/2015 15:41    2D Echo: n/a  Cardiac Cath: n/a  Admission HPI:  Ms. Chelsa Kushman is an 82 year old lady with PMH of Seizures on Keppra, Hemorrhagic Stroke in 2013, and recurrent UTIs on prophylactic Macrobid who presents after a fall.  Majority of history is obtained from family and chart review as patient is not communicative due to mental status change. She was apparently walking out of the bathroom using her walker when she fell face forward onto the floor. She hit her head and had an avulsion of her right incisor tooth. This was witnessed by her caretaker, whom she lives with. Family is unaware of any preceding symptoms prior to the fall and unsure if the altered mental status occurred prior to fall or after. They do report that her prior seizures were similar in presentation in which she would have an "out of it" appearance with some confusion. They say she has never had tonic-clonic seizures. They report that she is usually talkative at baseline and can ambulate using a walker without much issue. They say her last seizure was one year ago and has been well-controlled on Keppra which they believe she is adherent. Family reports patient was noted to have a brain mass in the past which they decided not to treat/pursue further intervention. Her only other medications are Colace for chronic constipation and Macrobid prophylaxis for recurrent UTIs.  Family states that patient is a never smoker and never used alcohol or illicit drugs. They report a family history of dementia in patient's sister. She has no known drug allergies. Her PCP is Dr. Simon Rhein in [place].  In the ED, vitals were: BP 148/90 mmHg  Pulse 62  Temp(Src) 92.7 F (33.7 C) (Rectal)  Resp 15  SpO2 100% Her blood glucose was 62 and she was given D50 once. CT Head showed old right frontoparietal ischemia, tiny areas of subarachnoid hemorrhage along the vertex bilaterally with no ventricular component. No acute infarct was seen. Maxillofacial CT showed mild soft tissue swelling over the right cheek, but no acute fracture. CT cervical spine shows multilevel degenerative changes without acute abnormality. Neurology and Neurosurgery were consulted.  Hospital Course by  problem list: Active Problems:   SAH (subarachnoid hemorrhage) (HCC)   Subarachnoid bleed (HCC)   Complicated UTI (urinary tract infection)   Seizure disorder as sequela of cerebrovascular accident ([place])   Acute encephalopathy   Hypoglycemia, unspecified   Body temperature low   Anemia due to bone marrow failure ([place])   Adrenal insufficiency ([place])   Acute encephalopathy: Patient presented after fall and with altered mental status, notably changed from her baseline per family. She was very lethargic, non-verbal, and not interactive. She was noted to be hypothermic and bradycardic. Urine was suspicious for UTI and she was started on IV Ceftriaxone as well as IV Vancomycin due to initial concern for meningitis. It was thought her UTI lowered her seizure threshold and she was in a post-ictal state. Neurology was consulted and loaded patient on IV Keppra 1500  mg BID. Continuous EEG was performed which showed changes suggestive of encephalopathy, but no electrographic seizures. AM cortisol was low at 5.1 and patient was started on empiric IV hydrocortisone with good response and improvement of hypothermia. She became more alert and interactive and per family returned to her baseline which appeared to be pleasantly demented. Rehab medicine was consulted and recommended [place] on discharge.  Adrenal insufficiency: AM Cortisol was low at 5.1. Empiric hydrocortisone was given with good response. Steroids were held for Cosyntropin stimulation test which resulted in a baseline cortisol of 6.2 and 30 minute Cortisol of 6.1. Unfortunately the 60 minute lab was not drawn. ACTH was low at 5.1 indicating a central process. Patient was started on oral hydrocortisone 20 mg at 6 AM and 10 mg at 4 PM daily. MRI in 06/2014 was noted to have changes suggestive of pituitary adenoma.  Seizure disorder: Patient thought to be in post-ictal state on presentation. Neurology was consulted and loaded IV Keppra.  Continuous EEG showed changes consistent with encephalopathy, no electrographic seizures. Keppra was transitioned to oral administration after patient passed swallow evaluation. This was increased by Neurology from home dose of 1000 mg BID to 1500 mg BID  UTI: UA and microscopy were suspicious for UTI. She was started on IV Ceftriaxone. Urine cultures were negative and antibiotics were discontinued. Home Macrobid prophylaxis was continued on discharge.  Subarachnoid hemorrhage: CT Head in the ED showed old right frontoparietal ischemia, tiny areas of subarachnoid hemorrhage along the vertex bilaterally with no ventricular component. No acute infarct was seen. Maxillofacial CT showed mild soft tissue swelling over the right cheek, but no acute fracture. Neurosurgery was consulted by ED physician who did not think intervention was necessary.    Discharge Vitals:   BP 142/66 mmHg  Pulse 50  Temp(Src) 97.4 F (36.3 C) (Oral)  Resp 17  Ht 5\' 7"  (1.702 m)  Wt 169 lb (76.658 kg)  BMI 26.46 kg/m2  SpO2 99%  Discharge Labs:  Results for orders placed or performed during the hospital encounter of 05/06/15 (from the past 24 hour(s))  Glucose, capillary     Status: None   Collection Time: 05/11/15 12:37 PM  Result Value Ref Range   Glucose-Capillary 80 65 - 99 mg/dL  Glucose, capillary     Status: None   Collection Time: 05/11/15  5:50 PM  Result Value Ref Range   Glucose-Capillary 98 65 - 99 mg/dL  Glucose, capillary     Status: None   Collection Time: 05/12/15 12:16 AM  Result Value Ref Range   Glucose-Capillary 93 65 - 99 mg/dL   Comment 1 Notify RN   CBC     Status: Abnormal   Collection Time: 05/12/15  9:21 AM  Result Value Ref Range   WBC 3.7 (L) 4.0 - 10.5 K/uL   RBC 4.03 3.87 - 5.11 MIL/uL   Hemoglobin 11.7 (L) 12.0 - 15.0 g/dL   HCT 34.7 (L) 36.0 - 46.0 %   MCV 86.1 78.0 - 100.0 fL   MCH 29.0 26.0 - 34.0 pg   MCHC 33.7 30.0 - 36.0 g/dL   RDW 14.8 11.5 - 15.5 %   Platelets 137  (L) 150 - 400 K/uL    Signed: Zada Finders, MD 05/12/2015, 11:28 AM    Services Ordered on Discharge: SNF Equipment Ordered on Discharge: none

## 2015-05-10 NOTE — Progress Notes (Signed)
Subjective: Patient awake, alert, and interactive this morning. She continues to display confusion, telling me she is in [place] even after informing her she is at [facility] in [place], [place]. She appears to have some difficulty understanding and answering simple questions, otherwise she follows commands appropriately. She tells me she feels "just fine" this morning and denies any fever, chills, or dysuria. She says she is eating well and using the bathroom well.  Objective: Vital signs in last 24 hours: Filed Vitals:   05/09/15 1432 05/09/15 1544 05/09/15 2055 05/10/15 0538  BP: 120/51  145/62 122/61  Pulse: 40  42 58  Temp: 93.6 F (34.2 C) 93.6 F (34.2 C) 97.3 F (36.3 C) 97.1 F (36.2 C)  TempSrc: Rectal Rectal Rectal Axillary  Resp: 14  15 15   Height:      Weight:      SpO2: 100%  100% 99%   Weight change:   Intake/Output Summary (Last 24 hours) at 05/10/15 1103 Last data filed at 05/10/15 [id number]  Gross per 24 hour  Intake   1098 ml  Output    800 ml  Net    298 ml   General: resting in bed, interactive, pleasant Cardiac: distant heart sounds, RRR Pulm: no wheezing, rales, rhonchi Abd: soft, nontender, nondistended, +BS Ext: no pedal edema, moves all extremities Neuro: pleasant, interactive, oriented to person but not place even after told she is in [place], squeezes fingers and following commands, apparent cognitive deficit however unclear if this is her baseline  Assessment/Plan: Active Problems:   SAH (subarachnoid hemorrhage) (HCC)   Subarachnoid bleed (HCC)   Complicated UTI (urinary tract infection)   Seizure disorder as sequela of cerebrovascular accident ([place])   Acute encephalopathy   Hypoglycemia, unspecified   Body temperature low   Anemia due to bone marrow failure ([place])   Adrenal insufficiency: AM Cortisol was low at 5.1 on 3/20. Possibly related to urinary infection. Responded well to empiric Solu-cortef. She was again noted to be  hypothermic yesterday. Have held steroids yesterday afternoon for ACTH stim test this morning >> Cortisol base is 6.2, 30 minutes is 6.1, 60 minutes is pending. She did have an MRI in 06/2014 which showed pituitary enlargement suggestive of an underlying adenoma which may be related to her adrenal insufficiency. Will start hydrocortisone therapy. -f/u 60 minute cortisol -Check ACTH -Hydrocortisone 20 mg 6AM and 10 mg 4PM daily   UTI: Patient with history of seizures and apparent fall and altered mental status. It is unclear if fall or AMS was present first. Per family she is talkative at her baseline and her current confusion and "out of it" appearance is consistent with prior seizures. She has history of recurrent UTIs and on prophylactic Macrobid at home. UA on admission was suggestive of UTI. This was thought to have lowered her seizure threshold with resultant fall. As urine cultures have been negative we will discontinue IV Ceftriaxone. -d/c IV Ceftriaxone 1g q24h -urine and blood cultures >> NGTD -Monitor mental status -SLP recommending CIR -PT/OT eval pending  Seizure disorder: Per neurology -Keppra per Neurology >> switching IV to PO same dose today 1500 mg BID -Continuous EEG >> encephalopathy, no electrographic seizures   AKI: Resolving, SCr improved to 1.02 compared to 1.36 on admission. Baseline appears to be around 0.80-0.90 -oral hydration   Dispo: Disposition is deferred at this time, awaiting improvement of current medical problems.  Anticipated discharge in approximately 1-2 day(s).     LOS: 4 days  Mariah Finders, MD 05/10/2015, 11:03 AM

## 2015-05-10 NOTE — Evaluation (Signed)
Physical Therapy Evaluation Patient Details Name: Mariah Arias MRN: [id number] DOB: 04-21-1924 Today's Date: 05/10/2015   History of Present Illness  82 y.o. female with h/o seizures on Keppra, hemorrhage stroke (2013), recurrent UTIs, who presented to ED after fall and AMS. CT Head 3/18 changes consistent with mild subarachnoid hemorrhage along vertex bilaterally.  Clinical Impression   Pt admitted with above diagnosis. Pt currently with functional limitations due to the deficits listed below (see PT Problem List).  Pt will benefit from skilled PT to increase their independence and safety with mobility to allow discharge to the venue listed below.       Follow Up Recommendations CIR    Equipment Recommendations  Other (comment) (to be determined)    Recommendations for Other Services Rehab consult     Precautions / Restrictions Precautions Precautions: Fall Restrictions Weight Bearing Restrictions: No      Mobility  Bed Mobility Overal bed mobility: Needs Assistance;+2 for physical assistance;+ 2 for safety/equipment Bed Mobility: Rolling;Sidelying to Sit Rolling: Max assist;+2 for physical assistance;+2 for safety/equipment Sidelying to sit: Max assist;+2 for physical assistance;+2 for safety/equipment       General bed mobility comments: Multimodal cueing for technique, sequencing, safety   Transfers Overall transfer level: Needs assistance Equipment used: None;2 person hand held assist Transfers: Sit to/from [facility] Sit to Stand: Total assist;+2 physical assistance;+2 safety/equipment Stand pivot transfers: Total assist;+2 physical assistance;+2 safety/equipment       General transfer comment: Pt with significant posterior lean in sitting AND standing. Increased assistance and time needed. Knees blocked for stability during transfer; Pt with difficulty following commands for functional transfer EOB to recliner.    Ambulation/Gait                Stairs            Wheelchair Mobility    Modified Rankin (Stroke Patients Only)       Balance Overall balance assessment: Needs assistance Sitting-balance support: No upper extremity supported;Feet supported Sitting balance-Leahy Scale: Zero   Postural control: Posterior lean Standing balance support: Bilateral upper extremity supported Standing balance-Leahy Scale: Zero                               Pertinent Vitals/Pain Pain Assessment: No/denies pain Faces Pain Scale: No hurt    Home Living Family/patient expects to be discharged to:: Unsure   Available Help at Discharge: Family Type of Home: House           Additional Comments: Unsure, no family present and pt unable to state     Prior Function           Comments: Unsure, no family present and pt unable to state      Hand Dominance   Dominant Hand:  (unsure)    Extremity/Trunk Assessment   Upper Extremity Assessment: Defer to OT evaluation           Lower Extremity Assessment: Generalized weakness         Communication   Communication: Expressive difficulties;Receptive difficulties  Cognition Arousal/Alertness: Awake/alert Behavior During Therapy: WFL for tasks assessed/performed Overall Cognitive Status: Impaired/Different from baseline Area of Impairment: Orientation;Attention;Memory;Following commands;Safety/judgement;Awareness;Problem solving Orientation Level: Disoriented to;Person;Place;Time;Situation Current Attention Level: Sustained Memory: Decreased short-term memory;Decreased recall of precautions Following Commands: Follows one step commands inconsistently;Follows one step commands with increased time Safety/Judgement: Decreased awareness of safety;Decreased awareness of deficits Awareness: Intellectual Problem Solving: Slow processing;Decreased initiation;Difficulty  sequencing;Requires verbal cues;Requires tactile  cues      General Comments General comments (skin integrity, edema, etc.): Noting some L inattention    Exercises        Assessment/Plan    PT Assessment Patient needs continued PT services  PT Diagnosis Difficulty walking;Generalized weakness;Altered mental status   PT Problem List Decreased strength;Decreased activity tolerance;Decreased balance;Decreased mobility;Decreased coordination;Decreased cognition;Decreased knowledge of use of DME;Decreased safety awareness;Decreased knowledge of precautions  PT Treatment Interventions DME instruction;Gait training;Functional mobility training;Therapeutic activities;Therapeutic exercise;Balance training;Neuromuscular re-education;Cognitive remediation;Patient/family education   PT Goals (Current goals can be found in the Care Plan section) Acute Rehab PT Goals Patient Stated Goal: none stated, pt unable PT Goal Formulation: Patient unable to participate in goal setting Time For Goal Achievement: 05/24/15 Potential to Achieve Goals: Good    Frequency Min 4X/week   Barriers to discharge   Need more info re: level of assist available at home    Co-evaluation PT/OT/SLP Co-Evaluation/Treatment: Yes Reason for Co-Treatment: Complexity of the patient's impairments (multi-system involvement);For patient/therapist safety PT goals addressed during session: Mobility/safety with mobility;Balance OT goals addressed during session: ADL's and self-care;Strengthening/ROM       End of Session Equipment Utilized During Treatment: Gait belt Activity Tolerance: Patient tolerated treatment well Patient left: in chair;with call bell/phone within reach;with chair alarm set Nurse Communication: Mobility status         Time: 1452-1510 PT Time Calculation (min) (ACUTE ONLY): 18 min   Charges:   PT Evaluation $PT Eval Moderate Complexity: 1 Procedure     PT G CodesQuin Arias 05/10/2015, 4:58 PM  Mariah Arias, [place] Pager [phone] Office [phone]

## 2015-05-10 NOTE — Evaluation (Signed)
Occupational Therapy Evaluation Patient Details Name: Mariah Arias MRN: [id number] DOB: 16-Jan-1925 Today's Date: 05/10/2015    History of Present Illness 82 y.o. female with h/o seizures on Keppra, hemorrhage stroke (2013), recurrent UTIs, who presented to ED after fall and AMS. CT Head 3/18 changes consistent with mild subarachnoid hemorrhage along vertex bilaterally.   Clinical Impression   Patient presenting with decreased ADL and functional mobility independence secondary to above. Unsure of patient's PLOF. Patient currently functioning at an overall max to total assist +2 for physical assistance AND safety. Patient will benefit from acute OT to increase overall independence in the areas of ADLs, functional mobility, and overall safety in order to safely discharge to venue listed below.     Follow Up Recommendations  CIR;Supervision/Assistance - 24 hour    Equipment Recommendations  Other (comment) (TBD)    Recommendations for Other Services  None at this time    Precautions / Restrictions Precautions Precautions: Fall Restrictions Weight Bearing Restrictions: No    Mobility Bed Mobility Overal bed mobility: Needs Assistance;+2 for physical assistance;+ 2 for safety/equipment Bed Mobility: Rolling;Sidelying to Sit Rolling: Max assist;+2 for physical assistance;+2 for safety/equipment Sidelying to sit: Max assist;+2 for physical assistance;+2 for safety/equipment       General bed mobility comments: Multimodal cueing for technique, sequencing, safety   Transfers Overall transfer level: Needs assistance Equipment used: None;2 person hand held assist Transfers: Sit to/from [facility] Sit to Stand: Total assist;+2 physical assistance;+2 safety/equipment Stand pivot transfers: Total assist;+2 physical assistance;+2 safety/equipment       General transfer comment: Pt with significant posterior lean in sitting AND standing. Increased  assistance and time needed. Pt with difficulty following commands for functional transfer EOB to recliner.     Balance Overall balance assessment: Needs assistance Sitting-balance support: No upper extremity supported;Feet supported Sitting balance-Leahy Scale: Zero   Postural control: Posterior lean Standing balance support: Bilateral upper extremity supported (holding onto therapist) Standing balance-Leahy Scale: Zero    ADL Overall ADL's : Needs assistance/impaired General ADL Comments: Pt overall total assist for ADLs, +2 helpful for safety. Pt requires max multimodal cueing to follow commands, for sequencing, for problem solving, for attention.      Vision Vision Assessment?: Vision impaired- to be further tested in functional context Additional Comments: Pt unable to track vertically or horizontally. Pt with difficulty following commands, would like to test vision more in functional context.    Perception Perception Perception Tested?: Yes Perception Deficits: Inattention/neglect Inattention/Neglect: Does not attend to left visual field Spatial deficits: Finger to/from nose test: pt with difficulty completing this. Pt was able to follow command, therapist each time saying "touch your nose...touch my finger". Pt could not follow finger when therapist started to move finger. At one point finger more on patient's left side and she touched therapist finger that was resting on her right arm rest. To be further assessed.    Praxis Praxis Praxis tested?: Deficits Deficits: Perseveration    Pertinent Vitals/Pain Pain Assessment: No/denies pain Faces Pain Scale: No hurt     Hand Dominance  (unsure)   Extremity/Trunk Assessment Upper Extremity Assessment Upper Extremity Assessment: Generalized weakness (difficult to fully asses secondary to decreased cognition)   Lower Extremity Assessment Lower Extremity Assessment: Defer to PT evaluation       Communication  Communication Communication: Expressive difficulties;Receptive difficulties   Cognition Arousal/Alertness: Awake/alert Behavior During Therapy: WFL for tasks assessed/performed Overall Cognitive Status: Impaired/Different from baseline Area of Impairment: Orientation;Attention;Memory;Following commands;Safety/judgement;Awareness;Problem solving Orientation  Level: Disoriented to;Person;Place;Time;Situation Current Attention Level: Sustained Memory: Decreased short-term memory;Decreased recall of precautions Following Commands: Follows one step commands inconsistently;Follows one step commands with increased time Safety/Judgement: Decreased awareness of safety;Decreased awareness of deficits Awareness: Intellectual Problem Solving: Slow processing;Decreased initiation;Difficulty sequencing;Requires verbal cues;Requires tactile cues                Home Living Family/patient expects to be discharged to:: Unsure Additional Comments: Unsure, no family present and pt unable to state       Prior Functioning/Environment Comments: Unsure, no family present and pt unable to state     OT Diagnosis: Generalized weakness;Disturbance of vision;Cognitive deficits   OT Problem List: Decreased strength;Decreased range of motion;Decreased activity tolerance;Impaired balance (sitting and/or standing);Decreased coordination;Decreased cognition;Decreased safety awareness;Decreased knowledge of use of DME or AE;Decreased knowledge of precautions;Impaired sensation;Impaired tone   OT Treatment/Interventions: Self-care/ADL training;Therapeutic exercise;Energy conservation;DME and/or AE instruction;Therapeutic activities;Patient/family education;Balance training    OT Goals(Current goals can be found in the care plan section) Acute Rehab OT Goals Patient Stated Goal: none stated, pt unable OT Goal Formulation: Patient unable to participate in goal setting Time For Goal Achievement: 05/24/15 Potential to  Achieve Goals: Good  OT Frequency: Min 3X/week   Barriers to D/C: Unsure, no family present to clarify       Co-evaluation PT/OT/SLP Co-Evaluation/Treatment: Yes Reason for Co-Treatment: Complexity of the patient's impairments (multi-system involvement);For patient/therapist safety   OT goals addressed during session: ADL's and self-care;Strengthening/ROM      End of Session Equipment Utilized During Treatment: Gait belt Nurse Communication: Mobility status;Need for lift equipment  Activity Tolerance: Patient tolerated treatment well Patient left: in chair;with call bell/phone within reach;with chair alarm set   Time: 1455-1518 OT Time Calculation (min): 23 min Charges:  OT General Charges $OT Visit: 1 Procedure OT Evaluation $OT Eval High Complexity: 1 Procedure  Chrys Racer , MS, OTR/L, CLT Pager: [phone]  05/10/2015, 4:17 PM

## 2015-05-11 DIAGNOSIS — F0390 Unspecified dementia without behavioral disturbance: Secondary | ICD-10-CM

## 2015-05-11 LAB — GLUCOSE, CAPILLARY
GLUCOSE-CAPILLARY: 130 mg/dL — AB (ref 65–99)
GLUCOSE-CAPILLARY: 80 mg/dL (ref 65–99)
GLUCOSE-CAPILLARY: 98 mg/dL (ref 65–99)
Glucose-Capillary: 55 mg/dL — ABNORMAL LOW (ref 65–99)
Glucose-Capillary: 73 mg/dL (ref 65–99)

## 2015-05-11 LAB — CULTURE, BLOOD (ROUTINE X 2)
Culture: NO GROWTH
Culture: NO GROWTH

## 2015-05-11 LAB — ACTH: C206 ACTH: 5.1 pg/mL — ABNORMAL LOW (ref 7.2–63.3)

## 2015-05-11 MED ORDER — HYDROCORTISONE 20 MG PO TABS: 20.0000 mg | ORAL_TABLET | Freq: Every day | ORAL | Status: DC

## 2015-05-11 MED ORDER — HYDROCORTISONE 20 MG PO TABS
20.0000 mg | ORAL_TABLET | Freq: Every day | ORAL | Status: DC
  Administered 2015-05-12: 20 mg via ORAL
  Filled 2015-05-11: qty 1

## 2015-05-11 MED ORDER — LEVETIRACETAM 750 MG PO TABS: 1500.0000 mg | ORAL_TABLET | Freq: Two times a day (BID) | ORAL | Status: DC

## 2015-05-11 MED ORDER — HYDROCORTISONE 10 MG PO TABS: 10.0000 mg | ORAL_TABLET | Freq: Every day | ORAL | Status: DC

## 2015-05-11 NOTE — Progress Notes (Signed)
   Subjective: Patient awake, alert, and interactive this morning. Says she is doing "just fine." She is still pleasantly confused. I spoke to patient's son who states that he feels she is much improved and at her baseline now. Rehab medicine has seen patient and recommending SNF on discharge. Family has agreed that transition to a skilled nursing facility after discharge would be beneficial for patient.   Objective: Vital signs in last 24 hours: Filed Vitals:   05/10/15 1423 05/10/15 1720 05/10/15 2208 05/11/15 0557  BP: 121/60 169/69 152/62 146/58  Pulse: 50 60 57 59  Temp: 97.5 F (36.4 C)  98.1 F (36.7 C) 97.8 F (36.6 C)  TempSrc: Oral  Oral   Resp: 15 16 16 16   Height:      Weight:      SpO2: 100% 100% 100% 100%   Weight change:   Intake/Output Summary (Last 24 hours) at 05/11/15 1107 Last data filed at 05/11/15 0558  Gross per 24 hour  Intake    600 ml  Output    500 ml  Net    100 ml   General: resting in bed, interactive, pleasant Cardiac: distant heart sounds, RRR Pulm: no wheezing, rales, rhonchi Abd: soft, nontender, nondistended, +BS Ext: no pedal edema, moves all extremities Neuro: pleasant, interactive, oriented to person but not place even after told she is in [place], squeezes fingers and following commands, apparent cognitive deficit at baseline per family  Assessment/Plan: Active Problems:   SAH (subarachnoid hemorrhage) (HCC)   Subarachnoid bleed (HCC)   Complicated UTI (urinary tract infection)   Seizure disorder as sequela of cerebrovascular accident ([place])   Acute encephalopathy   Hypoglycemia, unspecified   Body temperature low   Anemia due to bone marrow failure (HCC)   Adrenal insufficiency (HCC)   Adrenal insufficiency: AM Cortisol was low at 5.1 on 3/20. Possibly related to urinary infection. Responded well to empiric Solu-cortef. Partial ACTH stim test completed >> Cortisol base is 6.2, 30 minutes is 6.1, 60 minutes was not drawn. She  did have an MRI in 06/2014 which showed pituitary enlargement suggestive of an underlying adenoma which may be related to her adrenal insufficiency. Would expect Cortisol response with stim test in secondary adrenal insufficiency, however if patient has had a long-term central process there may have been resultant adrenal atrophy. Continue hydrocortisone therapy and follow up ACTH. -f/u ACTH -Hydrocortisone 20 mg 6AM and 10 mg 4PM daily   UTI: Patient with history of seizures and apparent fall and altered mental status. It is unclear if fall or AMS was present first. Per family she is talkative at her baseline and her current confusion and "out of it" appearance is consistent with prior seizures. She has history of recurrent UTIs and on prophylactic Macrobid at home. UA on admission was suggestive of UTI. This was thought to have lowered her seizure threshold with resultant fall. Urine cultures have been negative and we have discontinued IV Ceftriaxone. -urine and blood cultures >> NGTD  Seizure disorder: Home Keppra increased per Neurology -Keppra per Neurology >> switched IV to PO same dose 1500 mg BID -Continuous EEG >> encephalopathy, no electrographic seizures   Dispo: Patient now stable for discharge to SNF when bed available.     LOS: 5 days   Zada Finders, MD 05/11/2015, 11:07 AM

## 2015-05-11 NOTE — NC FL2 (Signed)
[place] LEVEL OF CARE SCREENING TOOL     IDENTIFICATION  Patient Name: Mariah Arias Birthdate: Dec 06, 1924 Sex: female Admission Date (Current Location): 05/06/2015  [facility] and [place] Number:  Herbalist and Address:  [facility] [place] [address], [place], [place] [address]      Provider Number: [id number]  Attending Physician Name and Address:  Annia Belt, MD  Relative Name and Phone Number:       Current Level of Care: Hospital Recommended Level of Care: [place] Prior Approval Number:    Date Approved/Denied:   PASRR Number: [id number]  Discharge Plan: SNF    Current Diagnoses: Patient Active Problem List   Diagnosis Date Noted  . Adrenal insufficiency ([place]) 05/10/2015  . Anemia due to bone marrow failure ([place])   . Hypoglycemia, unspecified   . Body temperature low   . Complicated UTI (urinary tract infection) 05/07/2015  . Seizure disorder as sequela of cerebrovascular accident ([place]) 05/07/2015  . Acute encephalopathy   . SAH (subarachnoid hemorrhage) ([place]) 05/06/2015  . Subarachnoid bleed ([place]) 05/06/2015    Orientation RESPIRATION BLADDER Height & Weight     Self  Normal Incontinent Weight: 169 lb (76.658 kg) Height:  5\' 7"  (170.2 cm)  BEHAVIORAL SYMPTOMS/MOOD NEUROLOGICAL BOWEL NUTRITION STATUS   (n/a)   Incontinent Diet (Currently NPO, please see discharge summary.)  AMBULATORY STATUS COMMUNICATION OF NEEDS Skin   Limited Assist   Normal                       Personal Care Assistance Level of Assistance  Bathing, Feeding, Dressing Bathing Assistance: Maximum assistance Feeding assistance: Maximum assistance Dressing Assistance: Maximum assistance     Functional Limitations Info             SPECIAL CARE FACTORS FREQUENCY  OT (By licensed OT)                    Contractures      Additional Factors Info  Code Status, Allergies Code  Status Info: DNR Allergies Info: No known allergies           Current Medications (05/11/2015):  This is the current hospital active medication list Current Facility-Administered Medications  Medication Dose Route Frequency Provider Last Rate Last Dose  . acetaminophen (TYLENOL) tablet 650 mg  650 mg Oral Q4H PRN Milagros Loll, MD       Or  . acetaminophen (TYLENOL) suppository 650 mg  650 mg Rectal Q4H PRN Milagros Loll, MD      . docusate sodium (COLACE) capsule 100 mg  100 mg Oral BID PRN Zada Finders, MD      . hydrocortisone (CORTEF) tablet 10 mg  10 mg Oral Daily Zada Finders, MD   10 mg at 05/10/15 1619  . [START ON 05/12/2015] hydrocortisone (CORTEF) tablet 20 mg  20 mg Oral Daily Zada Finders, MD      . levETIRAcetam (KEPPRA) tablet 1,500 mg  1,500 mg Oral BID Zada Finders, MD   1,500 mg at 05/11/15 1000  . ondansetron (ZOFRAN) tablet 4 mg  4 mg Oral Q6H PRN Milagros Loll, MD       Or  . ondansetron [facility] injection 4 mg  4 mg Intravenous Q6H PRN Milagros Loll, MD         Discharge Medications: Please see discharge summary for a list of discharge medications.  Relevant Imaging  Results:  Relevant Lab Results:   Additional Information SSN: [id number]  Luna Kitchens [phone]

## 2015-05-11 NOTE — Progress Notes (Addendum)
Pt's CBG at 0603 was 55.  Gave 240 mL OJ and will recheck CBG after 30 minutes.  CBG at 0655 was 73.  Patient still drinking OJ as of this writing.

## 2015-05-11 NOTE — Clinical Social Work Note (Signed)
Clinical Social Work Assessment  Patient Details  Name: Mariah Arias MRN: [id number] Date of Birth: 1925/01/07  Date of referral:  05/11/15               Reason for consult:  Discharge Planning                Permission sought to share information with:  Facility Sport and exercise psychologist, Family Supports Permission granted to share information::  Yes, Verbal Permission Granted  Name::     Charlaine Dalton  Agency::  [facility] SNF  Relationship::  Son  Contact Information:  cell: [phone], home: [phone]  Housing/Transportation Living arrangements for the past 2 months:   ([place]) Source of Information:  Adult Children Patient Interpreter Needed:  None Criminal Activity/Legal Involvement Pertinent to Current Situation/Hospitalization:  No - Comment as needed Significant Relationships:  Adult Children, Other(Comment) (Caregiver: Ila Mcgill) Lives with:  Other (Comment) ([place]) Do you feel safe going back to the place where you live?  Yes Need for family participation in patient care:  Yes (Comment) (Patient's son active in patient's care.)  Care giving concerns:  Patient's son expressed no concerns at this time.   Social Worker assessment / plan:  CSW received referral for possible SNF placement at time of discharge. CSW spoke with patient's son regarding patient's living arrangement prior to admission to [facility]. Per patient's son, patient currently resides at a [facility] supervised by Ila Mcgill. Patient's son reports patient has been a resident at this [facility] since November of 2016. Patient's son expressed understanding of PT recommendation for either CIR or SNF care at time of discharge. Patient's son informed CSW patient has previously completed short-term rehabilitation at [facility] in [place] and is agreeable to SNF search in [place]. CSW to continue to follow and assist with discharge planning needs.  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  Patient/Family's Understanding of and Emotional Response to Diagnosis, Current Treatment, and Prognosis:  Patient's son understanding and agreeable to CSW plan of care.  Emotional Assessment Appearance:  Appears stated age Attitude/Demeanor/Rapport:  Other (Oriented to self only. CSW spoke wth patient's son.) Affect (typically observed):  Other (Oriented to self only. CSW spoke wth patient's son.) Orientation:  Oriented to Self Alcohol / Substance use:  Not Applicable Psych involvement (Current and /or in the community):  No (Comment) (Not appropriate on this admission.)  Discharge Needs  Concerns to be addressed:  No discharge needs identified Readmission within the last 30 days:  No Current discharge risk:  None Barriers to Discharge:  No Barriers Identified   Caroline Sauger, LCSW 05/11/2015, 11:32 AM [phone]

## 2015-05-11 NOTE — Progress Notes (Signed)
Physical Therapy Treatment Patient Details Name: Mariah Arias MRN: [id number] DOB: Jul 15, 1924 Today's Date: 05/11/2015    History of Present Illness 82 y.o. female with h/o seizures on Keppra, hemorrhage stroke (2013), recurrent UTIs, who presented to ED after fall and AMS. CT Head 3/18 changes consistent with mild subarachnoid hemorrhage along vertex bilaterally.    PT Comments    Patient making improvements with mobility and gait today.  Agree with need for SNF at discharge.  Follow Up Recommendations  SNF;Supervision/Assistance - 24 hour     Equipment Recommendations  Other (comment) (TBD)    Recommendations for Other Services       Precautions / Restrictions Precautions Precautions: Fall Restrictions Weight Bearing Restrictions: No    Mobility  Bed Mobility Overal bed mobility: Needs Assistance Bed Mobility: Supine to Sit;Sit to Supine     Supine to sit: Mod assist Sit to supine: Mod assist   General bed mobility comments: Verbal cues to move to EOB.  Patient with delay in following commands to initiate movement.  Once patient began moving LE's toward EOB, she was able to continue task with mod assist.  Assist bringing LE's onto bed to return to supine.  Transfers Overall transfer level: Needs assistance Equipment used: Rolling walker (2 wheeled) Transfers: Sit to/from Stand Sit to Stand: Mod assist;+2 physical assistance         General transfer comment: Verbal cues for hand placement.  Assist to power up to standing and for balance.  Patient performed x2.  Ambulation/Gait Ambulation/Gait assistance: Min assist;+2 physical assistance Ambulation Distance (Feet): 8 Feet (2' forward and backward x2 with sitting rest break) Assistive device: Rolling walker (2 wheeled) Gait Pattern/deviations: Step-through pattern;Decreased step length - right;Decreased step length - left;Shuffle;Festinating;Trunk flexed     General Gait Details: Patient with  slow, festinating gait.   Stairs            Wheelchair Mobility    Modified Rankin (Stroke Patients Only)       Balance           Standing balance support: Bilateral upper extremity supported Standing balance-Leahy Scale: Poor                      Cognition Arousal/Alertness: Awake/alert Behavior During Therapy: Flat affect Overall Cognitive Status: Impaired/Different from baseline Area of Impairment: Orientation;Attention;Memory;Following commands;Safety/judgement;Awareness;Problem solving Orientation Level: Disoriented to;Place;Time;Situation Current Attention Level: Sustained Memory: Decreased short-term memory;Decreased recall of precautions Following Commands: Follows one step commands inconsistently;Follows one step commands with increased time Safety/Judgement: Decreased awareness of safety;Decreased awareness of deficits   Problem Solving: Slow processing;Decreased initiation;Difficulty sequencing;Requires verbal cues;Requires tactile cues      Exercises      General Comments        Pertinent Vitals/Pain Pain Assessment: No/denies pain    Home Living                      Prior Function            PT Goals (current goals can now be found in the care plan section) Progress towards PT goals: Progressing toward goals    Frequency  Min 3X/week    PT Plan Current plan remains appropriate;Discharge plan needs to be updated;Frequency needs to be updated    Co-evaluation             End of Session Equipment Utilized During Treatment: Gait belt Activity Tolerance: Patient tolerated treatment well Patient left: in bed;with call  bell/phone within reach;with bed alarm set     Time: [id number] PT Time Calculation (min) (ACUTE ONLY): 11 min  Charges:  $Therapeutic Activity: 8-22 mins                    G Codes:      Despina Pole 05/13/2015, 1:38 PM Carita Pian. Sanjuana Kava, [place] Pager [phone]

## 2015-05-11 NOTE — Progress Notes (Addendum)
Patient ID: Velecia Kegler, female   DOB: April 05, 1924, 82 y.o.   MRN: [id number] Medicine attending: I examined this patient today together with resident physician Dr. Zada Finders and I concur with his evaluation and management plan which we discussed together. Family was here yesterday. They feel that she is back to her baseline and confirmed our clinical suspicion that she has baseline dementia. One of the time specimens from the [place] test did not get done (the 60 minute stimulated cortisol level) but baseline cortisol was low and did not rise at 30 minutes. Findings are consistent with adrenal insufficiency. Suspect this is secondary. Apparently an MRI scan of the brain done in May 2016 showed pituitary enlargement suggestive of underlying adenoma. We will go and check a corticotropin level. We will resume cortisone replacement in physiologic doses. She is much more cooperative with exam. No focal neurologic deficits other than her mental status. She is not felt to be a suitable candidate for inpatient rehabilitation but does need to be in a supervised environment as she recuperates from this hospitalization. Our care managers are trying to locate a suitable skilled nursing facility for her. When this is done she can be discharged.

## 2015-05-12 ENCOUNTER — Encounter (HOSPITAL_COMMUNITY): Payer: Self-pay | Admitting: *Deleted

## 2015-05-12 LAB — GLUCOSE, CAPILLARY
GLUCOSE-CAPILLARY: 89 mg/dL (ref 65–99)
GLUCOSE-CAPILLARY: 93 mg/dL (ref 65–99)

## 2015-05-12 LAB — CBC
HCT: 34.7 % — ABNORMAL LOW (ref 36.0–46.0)
HEMOGLOBIN: 11.7 g/dL — AB (ref 12.0–15.0)
MCH: 29 pg (ref 26.0–34.0)
MCHC: 33.7 g/dL (ref 30.0–36.0)
MCV: 86.1 fL (ref 78.0–100.0)
PLATELETS: 137 10*3/uL — AB (ref 150–400)
RBC: 4.03 MIL/uL (ref 3.87–5.11)
RDW: 14.8 % (ref 11.5–15.5)
WBC: 3.7 10*3/uL — ABNORMAL LOW (ref 4.0–10.5)

## 2015-05-12 LAB — CBC AND DIFFERENTIAL: WBC: 3.7 10^3/mL

## 2015-05-12 MED ORDER — HYDROCORTISONE 20 MG PO TABS: 20.0000 mg | ORAL_TABLET | Freq: Every day | ORAL | Status: DC

## 2015-05-12 MED ORDER — HYDROCORTISONE 10 MG PO TABS: 10.0000 mg | ORAL_TABLET | Freq: Every day | ORAL | Status: DC

## 2015-05-12 NOTE — Progress Notes (Signed)
Mariah Arias to be D/C'd to [facility]  per MD order. Discussed with the patient and all questions fully answered.  VSS, Skin clean, dry and intact without evidence of skin break down, no evidence of skin tears noted.  IV catheter discontinued intact. Site without signs and symptoms of complications. Dressing and pressure applied.  SNF packet prepared by Education officer, museum and sent with patient to facility.  Report called to RN at [facility].  Patient's family will be notified of her transfer.   Patient to be escorted via stretcher, and D/C to [facility] via EMS transport.

## 2015-05-12 NOTE — Progress Notes (Signed)
Inpatient Rehabilitation  Attempted to meet with patient yesterday to discuss IP Rehab MD's recommendations for follow up therapies at SNF level of care; however, patient unable to participate.  After chart review today see that little improvements in toleration have been made, OT and PT continue to recommend SNF level for follow up.  Will sign off at this time.    Carmelia Roller., CCC/SLP Admission Coordinator  [place]  Cell [phone]

## 2015-05-12 NOTE — Discharge Instructions (Signed)
Please continue your medications as prescribed and follow up with Dr. Simon Rhein.  We have added a steroid medication called Hydrocortisone. Take 20 mg in the morning and 10 mg 4-6 hours before bedtime. This is because your Cortisol level (natural steroid) is low.  Your seizure medicine, Keppra, was increased to 1500 mg twice a day from 1000 mg twice a day by the Neurologists.  We are transferring you to a rehab center to help gain your strength back.

## 2015-05-12 NOTE — Progress Notes (Signed)
Speech Language Pathology Treatment: Cognitive-Linquistic  Patient Details Name: Mariah Arias MRN: [id number] DOB: [birth year] Today's Date: 05/12/2015 Time: [id number] SLP Time Calculation (min) (ACUTE ONLY): 8 min  Assessment / Plan / Recommendation Clinical Impression  Pt was seen for skilled ST targeting cognitive goals.  Pt was awake, cooperative, and pleasantly confused this afternoon.  Pt required total assist to reorient to place and situation.  She was able to follow 1 step commands without difficulty but required max assist verbal and visual cues to follow 2 step commands due to impairments in working memory and sustained attention.  Pt only intermittently able to respond purposefully to engage in simple conversational exchanges with therapist.  Appreciate note from attending physician regarding baseline dementia per conversation with family.  No family present today to further verify.  Agree with recommendations for SNF placement.  If pt is at baseline per family's report, no further ST needs would be indicated at next level of care.      HPI HPI: 82 y.o. female with h/o seizures on Keppra, hemorrhage stroke (2013), recurrent UTIs, who presented to ED after fall and AMS. CT Head 3/18 changes consistent with mild subarachnoid hemorrhage along vertex bilaterally. CXR 3/18 no acute abnormality seen. No prior h/o SLP intervention found in chart.      SLP Plan  Continue with current plan of care     Recommendations                Follow up Recommendations: Skilled Nursing facility;24 hour supervision/assistance;Inpatient Rehab Plan: Continue with current plan of care     GO                Mariah Arias, Selinda Orion 05/12/2015, 1:56 PM

## 2015-05-12 NOTE — Progress Notes (Signed)
   Subjective: Patient awake, alert, and interactive this morning. She is still pleasantly confused, but says she has been doing fine for a few days. Discussed with family yesterday, who agree SNF is appropriate on discharge.   Objective: Vital signs in last 24 hours: Filed Vitals:   05/11/15 0557 05/11/15 1240 05/11/15 2028 05/12/15 0419  BP: 146/58 112/51 138/62 142/66  Pulse: 59 48 52 50  Temp: 97.8 F (36.6 C) 97.5 F (36.4 C) 97.6 F (36.4 C) 97.4 F (36.3 C)  TempSrc:  Oral Oral Oral  Resp: 16 16 17 17   Height:      Weight:      SpO2: 100% 100% 100% 99%   Weight change:   Intake/Output Summary (Last 24 hours) at 05/12/15 1117 Last data filed at 05/12/15 0900  Gross per 24 hour  Intake    480 ml  Output      0 ml  Net    480 ml   General: resting in bed, interactive, pleasant Cardiac: distant heart sounds, RRR Pulm: no wheezing, rales, rhonchi Abd: soft, nontender, nondistended, +BS Ext: no pedal edema, moves all extremities Neuro: pleasant, interactive, squeezes fingers and following commands, apparent cognitive deficit at baseline per family  Assessment/Plan: Active Problems:   SAH (subarachnoid hemorrhage) (HCC)   Subarachnoid bleed (HCC)   Complicated UTI (urinary tract infection)   Seizure disorder as sequela of cerebrovascular accident ([place])   Acute encephalopathy   Hypoglycemia, unspecified   Body temperature low   Anemia due to bone marrow failure (HCC)   Adrenal insufficiency (HCC)   Adrenal insufficiency: AM Cortisol was low at 5.1 on 3/20. Possibly related to urinary infection. Responded well to empiric Solu-cortef. Partial ACTH stim test completed >> Cortisol base is 6.2, 30 minutes is 6.1, 60 minutes was not drawn. She did have an MRI in 06/2014 which showed pituitary enlargement suggestive of an underlying adenoma which may be related to her adrenal insufficiency. ACTH is low at 5.1, indicating a central process likely from pituitary adenoma.  Continuing hydrocortisone therapy. -Hydrocortisone 20 mg 6AM and 10 mg 4PM daily   UTI: Patient with history of seizures and apparent fall and altered mental status. It is unclear if fall or AMS was present first. Per family she is talkative at her baseline and her current confusion and "out of it" appearance is consistent with prior seizures. She has history of recurrent UTIs and on prophylactic Macrobid at home. UA on admission was suggestive of UTI. This was thought to have lowered her seizure threshold with resultant fall. Urine cultures have been negative and we have discontinued IV Ceftriaxone. -urine and blood cultures >> NGTD  Seizure disorder: Home Keppra increased per Neurology -Keppra per Neurology >> switched IV to PO same dose 1500 mg BID -Continuous EEG >> encephalopathy, no electrographic seizures   Dispo: Patient now stable for discharge to SNF when bed available.     LOS: 6 days   Zada Finders, MD 05/12/2015, 11:17 AM

## 2015-05-12 NOTE — Clinical Social Work Note (Signed)
Clinical Social Worker facilitated patient discharge including contacting patient family and facility to confirm patient discharge plans.  Clinical information faxed to facility and family agreeable with plan.  CSW arranged ambulance transport via [facility] to [facility].  RN to call report prior to discharge.  Clinical Social Worker will sign off for now as social work intervention is no longer needed. Please consult [place] again if new need arises.  Glendon Axe, MSW, [place] [phone] 05/12/2015 2:04 PM

## 2015-05-12 NOTE — Progress Notes (Signed)
Physical Therapy Treatment Patient Details Name: Mariah Arias MRN: [id number] DOB: Sep 21, 1924 Today's Date: 05/12/2015    History of Present Illness 82 y.o. female with h/o seizures on Keppra, hemorrhage stroke (2013), recurrent UTIs, who presented to ED after fall and AMS. CT Head 3/18 changes consistent with mild subarachnoid hemorrhage along vertex bilaterally.    PT Comments    Patient not able to walk this session and could not take steps to the recliner with SPT. Patient with flat affect today. Continue to recommend SNF for ongoing Physical Therapy.     Follow Up Recommendations  SNF;Supervision/Assistance - 24 hour     Equipment Recommendations  Other (comment) (TBD)    Recommendations for Other Services Rehab consult     Precautions / Restrictions Precautions Precautions: Fall Restrictions Weight Bearing Restrictions: No    Mobility  Bed Mobility Overal bed mobility: Needs Assistance Bed Mobility: Supine to Sit;Sit to Supine Rolling: Max assist;+2 for physical assistance;+2 for safety/equipment Sidelying to sit: Max assist;+2 for physical assistance;+2 for safety/equipment       General bed mobility comments: Verbal cues to move to EOB.  Patient with delay in following commands to initiate movement.  Once patient began moving LE's toward EOB, she was able to continue task with mod assist.  Assist bringing LE's onto bed to return to supine.  Transfers Overall transfer level: Needs assistance     Sit to Stand: Mod assist;+2 physical assistance Stand pivot transfers: Total assist;+2 physical assistance;+2 safety/equipment       General transfer comment: Verbal cues for hand placement.  Assist to power up to standing and for balance.  Patient performed x2.  Ambulation/Gait Ambulation/Gait assistance: Max assist;+2 physical assistance           General Gait Details: Able to SPT to recliner but required max assist to position hips. Patient  unable to take steps.    Stairs            Wheelchair Mobility    Modified Rankin (Stroke Patients Only)       Balance                                    Cognition Arousal/Alertness: Awake/alert Behavior During Therapy: Flat affect Overall Cognitive Status: Impaired/Different from baseline Area of Impairment: Orientation;Attention;Memory;Following commands;Safety/judgement;Awareness;Problem solving Orientation Level: Disoriented to;Place;Time;Situation   Memory: Decreased short-term memory;Decreased recall of precautions Following Commands: Follows one step commands inconsistently;Follows one step commands with increased time Safety/Judgement: Decreased awareness of safety;Decreased awareness of deficits   Problem Solving: Slow processing;Decreased initiation;Difficulty sequencing;Requires verbal cues;Requires tactile cues      Exercises      General Comments        Pertinent Vitals/Pain Pain Assessment: No/denies pain Faces Pain Scale: No hurt    Home Living Family/patient expects to be discharged to:: Skilled nursing facility                    Prior Function            PT Goals (current goals can now be found in the care plan section) Progress towards PT goals: Progressing toward goals    Frequency  Min 3X/week    PT Plan Current plan remains appropriate    Co-evaluation             End of Session Equipment Utilized During Treatment: Gait belt Activity Tolerance: Patient tolerated treatment well  Patient left: with call bell/phone within reach;with chair alarm set;in chair     Time: [id number] PT Time Calculation (min) (ACUTE ONLY): 27 min  Charges:  $Therapeutic Activity: 23-37 mins                    G Codes:      [name] 05/12/2015, 11:19 AM  05/12/2015 Nyjai Graff, [name] PTA

## 2015-05-12 NOTE — Care Management Important Message (Signed)
Important Message  Patient Details  Name: Mariah Arias MRN: [id number] Date of Birth: 06/28/1924   Medicare Important Message Given:  Yes    Sheila Ocasio P [place] 05/12/2015, 12:55 PM

## 2015-05-15 ENCOUNTER — Encounter: Payer: Self-pay | Admitting: Internal Medicine

## 2015-05-15 ENCOUNTER — Non-Acute Institutional Stay (SKILLED_NURSING_FACILITY): Payer: Medicare Other | Admitting: Internal Medicine

## 2015-05-15 DIAGNOSIS — R5381 Other malaise: Secondary | ICD-10-CM | POA: Diagnosis not present

## 2015-05-15 DIAGNOSIS — D696 Thrombocytopenia, unspecified: Secondary | ICD-10-CM | POA: Diagnosis not present

## 2015-05-15 DIAGNOSIS — M25511 Pain in right shoulder: Secondary | ICD-10-CM

## 2015-05-15 DIAGNOSIS — K5900 Constipation, unspecified: Secondary | ICD-10-CM

## 2015-05-15 DIAGNOSIS — G40909 Epilepsy, unspecified, not intractable, without status epilepticus: Secondary | ICD-10-CM | POA: Diagnosis not present

## 2015-05-15 DIAGNOSIS — N390 Urinary tract infection, site not specified: Secondary | ICD-10-CM

## 2015-05-15 DIAGNOSIS — R413 Other amnesia: Secondary | ICD-10-CM

## 2015-05-15 DIAGNOSIS — D638 Anemia in other chronic diseases classified elsewhere: Secondary | ICD-10-CM

## 2015-05-15 DIAGNOSIS — E2740 Unspecified adrenocortical insufficiency: Secondary | ICD-10-CM | POA: Diagnosis not present

## 2015-05-15 NOTE — Progress Notes (Signed)
LOCATION: [facility]  PCP: Noe Gens, MD   Code Status: DNR  Goals of care: Advanced Directive information Advanced Directives 05/15/2015  Does patient have an advance directive? Yes  Type of Advance Directive Out of facility DNR (pink MOST or yellow form)  Does patient want to make changes to advanced directive? No - Patient declined  Copy of advanced directive(s) in chart? Yes     Extended Emergency Contact Information Primary Emergency Contact: McCauley,David Address: [address]          [place], [place] [address] [place] Phone: [phone] Mobile Phone: [phone] Relation: Son   No Known Allergies  Chief Complaint  Patient presents with  . New Admit To SNF    New Admission     HPI:  Patient is a 82 y.o. female seen today for short term rehabilitation post hospital admission from 05/06/15-05/12/15 with acute encephalopathy post fall in setting of urinary tract infection, seizure and adrenal insufficiency. She had recurrent hypothermia and cortisol level resulted low. She was treated with antibiotics, keppra and iv steroid. She is seen in her room today. She appears confused and is quiet during conversation. She was noted to have Shannon on CT scan of her head. Neurosurgery was consulted and no intervention was recommended. She is not participating in HPI and ROS. She has PMH of stroke, seizure disorder and recurrent UTI.   Review of Systems: Unable to obtain. Patient complained of right arm and shoulder pain.    Past Medical History  Diagnosis Date  . Complicated UTI (urinary tract infection) 05/07/2015  . Seizure disorder as sequela of cerebrovascular accident ([place]) 05/07/2015   No past surgical history on file. Social History:   has no tobacco, alcohol, and drug history on file.  No family history on file.  Medications:   Medication List       This list is accurate as of: 05/15/15  1:27 PM.  Always use your most recent med  list.               docusate sodium 100 MG capsule  Commonly known as:  COLACE  Take 100 mg by mouth 2 (two) times daily as needed for mild constipation.     hydrocortisone 20 MG tablet  Commonly known as:  CORTEF  Take 1 tablet (20 mg total) by mouth daily. Take at 6 AM daily.     hydrocortisone 10 MG tablet  Commonly known as:  CORTEF  Take 1 tablet (10 mg total) by mouth daily. Take at 4 PM daily (or 4-6 hours before bedtime).     levETIRAcetam 750 MG tablet  Commonly known as:  KEPPRA  Take 2 tablets (1,500 mg total) by mouth 2 (two) times daily.     nitrofurantoin (macrocrystal-monohydrate) 100 MG capsule  Commonly known as:  MACROBID  Take 100 mg by mouth daily.        Immunizations: Immunization History  Administered Date(s) Administered  . PPD Test 05/12/2015     Physical Exam: Filed Vitals:   05/15/15 1318  BP: 148/90  Pulse: 62  Temp: 97.8 F (36.6 C)  TempSrc: Oral  Resp: 20  Height: 5\' 7"  (1.702 m)  Weight: 169 lb (76.658 kg)  SpO2: 95%   Body mass index is 26.46 kg/(m^2).  General- elderly female, overweight, in no acute distress Head- normocephalic, atraumatic Nose- no maxillary or frontal sinus tenderness, no nasal discharge Throat- moist mucus membrane  Eyes- PERRLA, EOMI, no pallor,  no icterus, no discharge, normal conjunctiva, normal sclera Neck- no cervical lymphadenopathy Cardiovascular- normal s1,s2, no murmur, has ankle edema Respiratory- bilateral clear to auscultation, no wheeze, no rhonchi, no crackles, no use of accessory muscles Abdomen- bowel sounds present, soft, non tender Musculoskeletal- able to move all 4 extremities, generalized weakness, limited ROM to right shoulder Neurological- awake and alert, follows simple commmand Skin- warm and dry Psychiatry- unable to assess   Labs reviewed: Basic Metabolic Panel:  Recent Labs  05/06/15 1859  05/08/15 0340 05/09/15 0744 05/10/15 05/10/15 0420  NA  --   < > 145 145  140 140  K  --   < > 3.9 3.8  --  3.6  CL  --   < > 114* 113*  --  111  CO2  --   < > 22 23  --  24  GLUCOSE  --   < > 108* 122*  --  97  BUN  --   < > 10 8 10 10   CREATININE  --   < > 1.21* 1.02* 0.9 0.90  CALCIUM  --   < > 8.4* 8.8*  --  8.9  MG 2.2  --   --   --   --   --   PHOS 3.2  --   --   --   --   --   < > = values in this interval not displayed. Liver Function Tests:  Recent Labs  05/06/15 1448  AST 49*  ALT 37  ALKPHOS 157*  BILITOT 0.2*  PROT 7.0  ALBUMIN 3.6   No results for input(s): LIPASE, AMYLASE in the last 8760 hours. No results for input(s): AMMONIA in the last 8760 hours. CBC:  Recent Labs  05/06/15 1448  05/07/15 0313 05/08/15 0340 05/12/15 05/12/15 0921  WBC 5.4  --  4.6 3.6* 3.7 3.7*  NEUTROABS 4.1  --   --   --   --   --   HGB 13.0  < > 12.3 10.4*  --  11.7*  HCT 38.6  < > 35.2* 31.4*  --  34.7*  MCV 86.2  --  84.4 86.5  --  86.1  PLT 151  --  158 133*  --  137*  < > = values in this interval not displayed. Cardiac Enzymes:  Recent Labs  05/06/15 1859  TROPONINI <0.03   BNP: Invalid input(s): POCBNP CBG:  Recent Labs  05/11/15 1750 05/12/15 0016 05/12/15 1216  GLUCAP 98 93 89    Radiological Exams: Ct Head Wo Contrast  05/06/2015  CLINICAL DATA:  Right facial swelling, un known history, likely fall EXAM: CT HEAD WITHOUT CONTRAST CT MAXILLOFACIAL WITHOUT CONTRAST CT CERVICAL SPINE WITHOUT CONTRAST TECHNIQUE: Multidetector CT imaging of the head, cervical spine, and maxillofacial structures were performed using the standard protocol without intravenous contrast. Multiplanar CT image reconstructions of the cervical spine and maxillofacial structures were also generated. COMPARISON:  None. FINDINGS: CT HEAD FINDINGS Bony calvarium is intact.  No gross soft tissue abnormality is seen. Mild atrophic changes are seen. Areas of prior ischemia are noted in the right frontoparietal region. There are tiny areas of subarachnoid hemorrhage  identified near the vertex bilaterally No ventricular component is noted. No acute infarct is noted. CT MAXILLOFACIAL FINDINGS The bony structures of face appear intact without evidence of acute fracture. The paranasal sinuses are within normal limits. The orbits and their contents are unremarkable. Mild soft tissue swelling is noted over the right cheek. The  central incisor on the right is not present and appears to be of an acute nature. Correlation with the physical exam is recommended. Dental caries are seen. No other definitive acute tooth loss is seen. No other soft tissue abnormality is noted. CT CERVICAL SPINE FINDINGS Seven cervical segments are well visualized. Vertebral body height is well maintained. Multilevel osteophytic changes are seen. Facet hypertrophic changes are noted as well. No findings to suggest acute fracture or acute facet abnormality are seen. Visualized soft tissues are within normal limits. Mild carotid calcifications are seen. The lung apices are unremarkable as visualized. IMPRESSION: CT of the head: Changes consistent with mild subarachnoid hemorrhage along the vertex bilaterally. Prior right frontoparietal infarct. CT of the maxillofacial bones: No acute fracture is noted. There appears to be an acutely missing tooth involving the maxillary central incisor on the right. Clinical correlation is recommended. CT of the cervical spine: Multilevel degenerative change without acute abnormality. Critical Value/emergent results were called by telephone at the time of interpretation on 05/06/2015 at 3:38 pm to Dr. Rhina Brackett, who verbally acknowledged these results. Electronically Signed   By: Inez Catalina M.D.   On: 05/06/2015 15:41   Ct Cervical Spine Wo Contrast  05/06/2015  CLINICAL DATA:  Right facial swelling, un known history, likely fall EXAM: CT HEAD WITHOUT CONTRAST CT MAXILLOFACIAL WITHOUT CONTRAST CT CERVICAL SPINE WITHOUT CONTRAST TECHNIQUE: Multidetector CT imaging of the head,  cervical spine, and maxillofacial structures were performed using the standard protocol without intravenous contrast. Multiplanar CT image reconstructions of the cervical spine and maxillofacial structures were also generated. COMPARISON:  None. FINDINGS: CT HEAD FINDINGS Bony calvarium is intact.  No gross soft tissue abnormality is seen. Mild atrophic changes are seen. Areas of prior ischemia are noted in the right frontoparietal region. There are tiny areas of subarachnoid hemorrhage identified near the vertex bilaterally No ventricular component is noted. No acute infarct is noted. CT MAXILLOFACIAL FINDINGS The bony structures of face appear intact without evidence of acute fracture. The paranasal sinuses are within normal limits. The orbits and their contents are unremarkable. Mild soft tissue swelling is noted over the right cheek. The central incisor on the right is not present and appears to be of an acute nature. Correlation with the physical exam is recommended. Dental caries are seen. No other definitive acute tooth loss is seen. No other soft tissue abnormality is noted. CT CERVICAL SPINE FINDINGS Seven cervical segments are well visualized. Vertebral body height is well maintained. Multilevel osteophytic changes are seen. Facet hypertrophic changes are noted as well. No findings to suggest acute fracture or acute facet abnormality are seen. Visualized soft tissues are within normal limits. Mild carotid calcifications are seen. The lung apices are unremarkable as visualized. IMPRESSION: CT of the head: Changes consistent with mild subarachnoid hemorrhage along the vertex bilaterally. Prior right frontoparietal infarct. CT of the maxillofacial bones: No acute fracture is noted. There appears to be an acutely missing tooth involving the maxillary central incisor on the right. Clinical correlation is recommended. CT of the cervical spine: Multilevel degenerative change without acute abnormality. Critical  Value/emergent results were called by telephone at the time of interpretation on 05/06/2015 at 3:38 pm to Dr. Rhina Brackett, who verbally acknowledged these results. Electronically Signed   By: Inez Catalina M.D.   On: 05/06/2015 15:41   Dg Chest Portable 1 View  05/06/2015  CLINICAL DATA:  Recent fall, initial encounter EXAM: PORTABLE CHEST 1 VIEW COMPARISON:  None. FINDINGS: Cardiac shadow is within normal  limits. The lungs are well aerated bilaterally. No pneumothorax or effusion is seen. No acute bony abnormality is noted. IMPRESSION: No acute abnormality seen. Electronically Signed   By: Inez Catalina M.D.   On: 05/06/2015 15:44   Dg Hand Complete Left  05/06/2015  CLINICAL DATA:  Recent fall.  Hand swelling. EXAM: LEFT HAND - COMPLETE 3+ VIEW COMPARISON:  None FINDINGS: Pulse oximeter on the tip of the index finger. Negative for a fracture or dislocation. Alignment of the hand is normal. Soft tissues are unremarkable. Mild joint space narrowing in the interpharyngeal joints. IMPRESSION: No acute abnormality to the left hand. Electronically Signed   By: Markus Daft M.D.   On: 05/06/2015 15:44   Ct Maxillofacial Wo Cm  05/06/2015  CLINICAL DATA:  Right facial swelling, un known history, likely fall EXAM: CT HEAD WITHOUT CONTRAST CT MAXILLOFACIAL WITHOUT CONTRAST CT CERVICAL SPINE WITHOUT CONTRAST TECHNIQUE: Multidetector CT imaging of the head, cervical spine, and maxillofacial structures were performed using the standard protocol without intravenous contrast. Multiplanar CT image reconstructions of the cervical spine and maxillofacial structures were also generated. COMPARISON:  None. FINDINGS: CT HEAD FINDINGS Bony calvarium is intact.  No gross soft tissue abnormality is seen. Mild atrophic changes are seen. Areas of prior ischemia are noted in the right frontoparietal region. There are tiny areas of subarachnoid hemorrhage identified near the vertex bilaterally No ventricular component is noted. No acute  infarct is noted. CT MAXILLOFACIAL FINDINGS The bony structures of face appear intact without evidence of acute fracture. The paranasal sinuses are within normal limits. The orbits and their contents are unremarkable. Mild soft tissue swelling is noted over the right cheek. The central incisor on the right is not present and appears to be of an acute nature. Correlation with the physical exam is recommended. Dental caries are seen. No other definitive acute tooth loss is seen. No other soft tissue abnormality is noted. CT CERVICAL SPINE FINDINGS Seven cervical segments are well visualized. Vertebral body height is well maintained. Multilevel osteophytic changes are seen. Facet hypertrophic changes are noted as well. No findings to suggest acute fracture or acute facet abnormality are seen. Visualized soft tissues are within normal limits. Mild carotid calcifications are seen. The lung apices are unremarkable as visualized. IMPRESSION: CT of the head: Changes consistent with mild subarachnoid hemorrhage along the vertex bilaterally. Prior right frontoparietal infarct. CT of the maxillofacial bones: No acute fracture is noted. There appears to be an acutely missing tooth involving the maxillary central incisor on the right. Clinical correlation is recommended. CT of the cervical spine: Multilevel degenerative change without acute abnormality. Critical Value/emergent results were called by telephone at the time of interpretation on 05/06/2015 at 3:38 pm to Dr. Rhina Brackett, who verbally acknowledged these results. Electronically Signed   By: Inez Catalina M.D.   On: 05/06/2015 15:41    Assessment/Plan  Physical deconditioning Will have her work with physical therapy and occupational therapy team to help with gait training and muscle strengthening exercises.fall precautions. Skin care. Encourage to be out of bed.   Urinary tract infection Has completed her antibiotics. Continue macrobid 100 mg daily for prophylaxis.  Hydration and perineal hygiene to be maintained  Seizure No seizure reported in facility. Continue keppra 1500 mg bid  Adrenal insufficiency Continue cortef 20 mg in am and 10 mg in pm. Monitor VS daily x 1 week.   Right shoulder pain Had a fall prior to hospital admission. Get xray right shoulder and right humerus to  rule out fracture. Start tylenol 650 mg bid for pain x 1 week.   Impaired memory No documented history of dementia but per discharge summary, per family report has poor memory. Continue PT, OT. Get SLP consult given cognitive impairment. assistance with feeding. fall precautions.   Thrombocytopenia No signs of bleed. Monitor cbc  Constipation Continue colace 100 mg bid prn and monitor  Anemia of chronic disease Monitor cbc   Goals of care: short term rehabilitation   Labs/tests ordered: cbc, cmp 05/17/15, xrays as above  Family/ staff Communication: reviewed care plan with patient and nursing supervisor    Blanchie Serve, MD Internal Medicine [place], [place] [address] Cell Phone (Monday-Friday 8 am - 5 pm): [phone] On Call: [phone] and follow prompts after 5 pm and on weekends Office Phone: [phone] Office Fax: [phone]

## 2015-05-18 ENCOUNTER — Non-Acute Institutional Stay (SKILLED_NURSING_FACILITY): Payer: Medicare Other | Admitting: Family

## 2015-05-18 DIAGNOSIS — E870 Hyperosmolality and hypernatremia: Secondary | ICD-10-CM

## 2015-05-18 DIAGNOSIS — W19XXXA Unspecified fall, initial encounter: Secondary | ICD-10-CM | POA: Diagnosis not present

## 2015-05-18 DIAGNOSIS — IMO0001 Reserved for inherently not codable concepts without codable children: Secondary | ICD-10-CM

## 2015-05-18 NOTE — Progress Notes (Signed)
Patient ID: Mariah Arias, female   DOB: 11-18-1924, 82 y.o.   MRN: [id number]  Location:  [facility] of Service:  SNF [phone]) Provider: Blanchie Serve, MD   Mariah Gens, MD  Patient Care Team: Cedric Inez Catalina, MD as PCP - General (Internal Medicine)  Extended Emergency Contact Information Primary Emergency Contact: [facility] Address: [address]          [place], [place] [address] [place] Phone: [phone] Mobile Phone: [phone] Relation: Son  Code Status: DNR Goals of care: Advanced Directive information Advanced Directives 05/15/2015  Does patient have an advance directive? Yes  Type of Advance Directive Out of facility DNR (pink MOST or yellow form)  Does patient want to make changes to advanced directive? No - Patient declined  Copy of advanced directive(s) in chart? Yes     Chief Complaint  Patient presents with  . Acute Visit    HPI:  Pt is a 82 y.o. female seen today at [facility] for an acute visit for follow up abnormal labs. She has a medical history of Seizures, CVA, encephalopathy among others. She is seen in her room today. Her recent labs results showed Na 150 05/17/2015.Facility staff reports past sustained a fall by the nursing station just prior to visit.  Patient offer water to drink but states need a glass of Liquor. Patient confused unable to complete HPI and ROS though answers appropriate questions at times.   Past Medical History  Diagnosis Date  . Complicated UTI (urinary tract infection) 05/07/2015  . Seizure disorder as sequela of cerebrovascular accident ([place]) 05/07/2015   No past surgical history on file.  No Known Allergies    Medication List       This list is accurate as of: 05/18/15  4:55 PM.  Always use your most recent med list.               acetaminophen 650 MG CR tablet  Commonly known as:  TYLENOL  Take 650 mg by mouth every 8  (eight) hours as needed for pain.     docusate sodium 100 MG capsule  Commonly known as:  COLACE  Take 100 mg by mouth 2 (two) times daily as needed for mild constipation.     hydrocortisone 20 MG tablet  Commonly known as:  CORTEF  Take 1 tablet (20 mg total) by mouth daily. Take at 6 AM daily.     hydrocortisone 10 MG tablet  Commonly known as:  CORTEF  Take 1 tablet (10 mg total) by mouth daily. Take at 4 PM daily (or 4-6 hours before bedtime).     levETIRAcetam 750 MG tablet  Commonly known as:  KEPPRA  Take 2 tablets (1,500 mg total) by mouth 2 (two) times daily.     nitrofurantoin (macrocrystal-monohydrate) 100 MG capsule  Commonly known as:  MACROBID  Take 100 mg by mouth daily.        Review of Systems  Unable to perform ROS: Other    Immunization History  Administered Date(s) Administered  . PPD Test 05/12/2015   Pertinent  Health Maintenance Due  Topic Date Due  . DEXA SCAN  10/26/1991  . PNA vac Low Risk Adult (1 of 2 - PCV13) 10/26/1991  . INFLUENZA VACCINE  09/19/2014   No flowsheet data found. Functional Status Survey:    Filed Vitals:   05/18/15 1625  BP: 139/59  Pulse: 50  Temp:  97.8 F (36.6 C)  Resp: 18  Height: 5\' 7"  (1.702 m)  Weight: 169 lb (76.658 kg)  SpO2: 97%   Body mass index is 26.46 kg/(m^2). Physical Exam  Constitutional: She appears well-developed and well-nourished. No distress.  HENT:  Head: Normocephalic.  Mouth/Throat: Oropharynx is clear and moist.  Eyes: Conjunctivae and EOM are normal. Pupils are equal, round, and reactive to light. Right eye exhibits no discharge. Left eye exhibits no discharge. No scleral icterus.  Neck: Normal range of motion. No JVD present. No tracheal deviation present.  Cardiovascular: Normal rate, regular rhythm, normal heart sounds and intact distal pulses.  Exam reveals no gallop and no friction rub.   No murmur heard. Pulmonary/Chest: Effort normal and breath sounds normal. No respiratory  distress. She has no wheezes. She has no rales.  Abdominal: Soft. Bowel sounds are normal. She exhibits no distension and no mass. There is no tenderness. There is no rebound and no guarding.  Musculoskeletal: Normal range of motion. She exhibits no edema or tenderness.  Lymphadenopathy:    She has no cervical adenopathy.  Neurological: She is alert.  Quiet answers question appropriately at times.   Skin: Skin is warm and dry. No rash noted. No erythema. No pallor.  Psychiatric: She has a normal mood and affect.    Labs reviewed:  Recent Labs  05/06/15 1859  05/08/15 0340 05/09/15 0744 05/10/15 05/10/15 0420  NA  --   < > 145 145 140 140  K  --   < > 3.9 3.8  --  3.6  CL  --   < > 114* 113*  --  111  CO2  --   < > 22 23  --  24  GLUCOSE  --   < > 108* 122*  --  97  BUN  --   < > 10 8 10 10   CREATININE  --   < > 1.21* 1.02* 0.9 0.90  CALCIUM  --   < > 8.4* 8.8*  --  8.9  MG 2.2  --   --   --   --   --   PHOS 3.2  --   --   --   --   --   < > = values in this interval not displayed.  Recent Labs  05/06/15 1448  AST 49*  ALT 37  ALKPHOS 157*  BILITOT 0.2*  PROT 7.0  ALBUMIN 3.6    Recent Labs  05/06/15 1448  05/07/15 0313 05/08/15 0340 05/12/15 05/12/15 0921  WBC 5.4  --  4.6 3.6* 3.7 3.7*  NEUTROABS 4.1  --   --   --   --   --   HGB 13.0  < > 12.3 10.4*  --  11.7*  HCT 38.6  < > 35.2* 31.4*  --  34.7*  MCV 86.2  --  84.4 86.5  --  86.1  PLT 151  --  158 133*  --  137*  < > = values in this interval not displayed. Lab Results  Component Value Date   TSH 1.348 05/06/2015   No results found for: HGBA1C No results found for: CHOL, HDL, LDLCALC, LDLDIRECT, TRIG, CHOLHDL  Significant Diagnostic Results in last 30 days:  Ct Head Wo Contrast  05/06/2015  CLINICAL DATA:  Right facial swelling, un known history, likely fall EXAM: CT HEAD WITHOUT CONTRAST CT MAXILLOFACIAL WITHOUT CONTRAST CT CERVICAL SPINE WITHOUT CONTRAST TECHNIQUE: Multidetector CT imaging of the  head, cervical spine, and  maxillofacial structures were performed using the standard protocol without intravenous contrast. Multiplanar CT image reconstructions of the cervical spine and maxillofacial structures were also generated. COMPARISON:  None. FINDINGS: CT HEAD FINDINGS Bony calvarium is intact.  No gross soft tissue abnormality is seen. Mild atrophic changes are seen. Areas of prior ischemia are noted in the right frontoparietal region. There are tiny areas of subarachnoid hemorrhage identified near the vertex bilaterally No ventricular component is noted. No acute infarct is noted. CT MAXILLOFACIAL FINDINGS The bony structures of face appear intact without evidence of acute fracture. The paranasal sinuses are within normal limits. The orbits and their contents are unremarkable. Mild soft tissue swelling is noted over the right cheek. The central incisor on the right is not present and appears to be of an acute nature. Correlation with the physical exam is recommended. Dental caries are seen. No other definitive acute tooth loss is seen. No other soft tissue abnormality is noted. CT CERVICAL SPINE FINDINGS Seven cervical segments are well visualized. Vertebral body height is well maintained. Multilevel osteophytic changes are seen. Facet hypertrophic changes are noted as well. No findings to suggest acute fracture or acute facet abnormality are seen. Visualized soft tissues are within normal limits. Mild carotid calcifications are seen. The lung apices are unremarkable as visualized. IMPRESSION: CT of the head: Changes consistent with mild subarachnoid hemorrhage along the vertex bilaterally. Prior right frontoparietal infarct. CT of the maxillofacial bones: No acute fracture is noted. There appears to be an acutely missing tooth involving the maxillary central incisor on the right. Clinical correlation is recommended. CT of the cervical spine: Multilevel degenerative change without acute abnormality.  Critical Value/emergent results were called by telephone at the time of interpretation on 05/06/2015 at 3:38 pm to Dr. Rhina Brackett, who verbally acknowledged these results. Electronically Signed   By: Inez Catalina M.D.   On: 05/06/2015 15:41   Ct Cervical Spine Wo Contrast  05/06/2015  CLINICAL DATA:  Right facial swelling, un known history, likely fall EXAM: CT HEAD WITHOUT CONTRAST CT MAXILLOFACIAL WITHOUT CONTRAST CT CERVICAL SPINE WITHOUT CONTRAST TECHNIQUE: Multidetector CT imaging of the head, cervical spine, and maxillofacial structures were performed using the standard protocol without intravenous contrast. Multiplanar CT image reconstructions of the cervical spine and maxillofacial structures were also generated. COMPARISON:  None. FINDINGS: CT HEAD FINDINGS Bony calvarium is intact.  No gross soft tissue abnormality is seen. Mild atrophic changes are seen. Areas of prior ischemia are noted in the right frontoparietal region. There are tiny areas of subarachnoid hemorrhage identified near the vertex bilaterally No ventricular component is noted. No acute infarct is noted. CT MAXILLOFACIAL FINDINGS The bony structures of face appear intact without evidence of acute fracture. The paranasal sinuses are within normal limits. The orbits and their contents are unremarkable. Mild soft tissue swelling is noted over the right cheek. The central incisor on the right is not present and appears to be of an acute nature. Correlation with the physical exam is recommended. Dental caries are seen. No other definitive acute tooth loss is seen. No other soft tissue abnormality is noted. CT CERVICAL SPINE FINDINGS Seven cervical segments are well visualized. Vertebral body height is well maintained. Multilevel osteophytic changes are seen. Facet hypertrophic changes are noted as well. No findings to suggest acute fracture or acute facet abnormality are seen. Visualized soft tissues are within normal limits. Mild carotid  calcifications are seen. The lung apices are unremarkable as visualized. IMPRESSION: CT of the head: Changes consistent with  mild subarachnoid hemorrhage along the vertex bilaterally. Prior right frontoparietal infarct. CT of the maxillofacial bones: No acute fracture is noted. There appears to be an acutely missing tooth involving the maxillary central incisor on the right. Clinical correlation is recommended. CT of the cervical spine: Multilevel degenerative change without acute abnormality. Critical Value/emergent results were called by telephone at the time of interpretation on 05/06/2015 at 3:38 pm to Dr. Rhina Brackett, who verbally acknowledged these results. Electronically Signed   By: Inez Catalina M.D.   On: 05/06/2015 15:41   Dg Chest Portable 1 View  05/06/2015  CLINICAL DATA:  Recent fall, initial encounter EXAM: PORTABLE CHEST 1 VIEW COMPARISON:  None. FINDINGS: Cardiac shadow is within normal limits. The lungs are well aerated bilaterally. No pneumothorax or effusion is seen. No acute bony abnormality is noted. IMPRESSION: No acute abnormality seen. Electronically Signed   By: Inez Catalina M.D.   On: 05/06/2015 15:44   Dg Hand Complete Left  05/06/2015  CLINICAL DATA:  Recent fall.  Hand swelling. EXAM: LEFT HAND - COMPLETE 3+ VIEW COMPARISON:  None FINDINGS: Pulse oximeter on the tip of the index finger. Negative for a fracture or dislocation. Alignment of the hand is normal. Soft tissues are unremarkable. Mild joint space narrowing in the interpharyngeal joints. IMPRESSION: No acute abnormality to the left hand. Electronically Signed   By: Markus Daft M.D.   On: 05/06/2015 15:44   Ct Maxillofacial Wo Cm  05/06/2015  CLINICAL DATA:  Right facial swelling, un known history, likely fall EXAM: CT HEAD WITHOUT CONTRAST CT MAXILLOFACIAL WITHOUT CONTRAST CT CERVICAL SPINE WITHOUT CONTRAST TECHNIQUE: Multidetector CT imaging of the head, cervical spine, and maxillofacial structures were performed using the  standard protocol without intravenous contrast. Multiplanar CT image reconstructions of the cervical spine and maxillofacial structures were also generated. COMPARISON:  None. FINDINGS: CT HEAD FINDINGS Bony calvarium is intact.  No gross soft tissue abnormality is seen. Mild atrophic changes are seen. Areas of prior ischemia are noted in the right frontoparietal region. There are tiny areas of subarachnoid hemorrhage identified near the vertex bilaterally No ventricular component is noted. No acute infarct is noted. CT MAXILLOFACIAL FINDINGS The bony structures of face appear intact without evidence of acute fracture. The paranasal sinuses are within normal limits. The orbits and their contents are unremarkable. Mild soft tissue swelling is noted over the right cheek. The central incisor on the right is not present and appears to be of an acute nature. Correlation with the physical exam is recommended. Dental caries are seen. No other definitive acute tooth loss is seen. No other soft tissue abnormality is noted. CT CERVICAL SPINE FINDINGS Seven cervical segments are well visualized. Vertebral body height is well maintained. Multilevel osteophytic changes are seen. Facet hypertrophic changes are noted as well. No findings to suggest acute fracture or acute facet abnormality are seen. Visualized soft tissues are within normal limits. Mild carotid calcifications are seen. The lung apices are unremarkable as visualized. IMPRESSION: CT of the head: Changes consistent with mild subarachnoid hemorrhage along the vertex bilaterally. Prior right frontoparietal infarct. CT of the maxillofacial bones: No acute fracture is noted. There appears to be an acutely missing tooth involving the maxillary central incisor on the right. Clinical correlation is recommended. CT of the cervical spine: Multilevel degenerative change without acute abnormality. Critical Value/emergent results were called by telephone at the time of  interpretation on 05/06/2015 at 3:38 pm to Dr. Rhina Brackett, who verbally acknowledged these results. Electronically Signed  By: Inez Catalina M.D.   On: 05/06/2015 15:41    Assessment/Plan Hypernatremia: Suspect decreased fluid intake. Facility staff to encourage fluid intake. Will order BMP in 05/19/2015. Will give IVF 1/2 Normal saline if sodium  continues to be elevate.  Fall No injuries sustained. Confused though answers questions appropriately at times.Ordering urine specimen for U/A and C/S   Family/ staff Communication: Reviewed plan with patient and facility Nurse.   Labs/tests ordered:  urine specimen for U/A and C/S

## 2015-05-23 ENCOUNTER — Non-Acute Institutional Stay (SKILLED_NURSING_FACILITY): Payer: Medicare Other | Admitting: Family

## 2015-05-23 DIAGNOSIS — E2740 Unspecified adrenocortical insufficiency: Secondary | ICD-10-CM

## 2015-05-23 DIAGNOSIS — K5901 Slow transit constipation: Secondary | ICD-10-CM

## 2015-05-23 DIAGNOSIS — N390 Urinary tract infection, site not specified: Secondary | ICD-10-CM

## 2015-05-23 DIAGNOSIS — K5900 Constipation, unspecified: Secondary | ICD-10-CM | POA: Insufficient documentation

## 2015-05-23 DIAGNOSIS — G40909 Epilepsy, unspecified, not intractable, without status epilepticus: Secondary | ICD-10-CM | POA: Diagnosis not present

## 2015-05-23 NOTE — Progress Notes (Signed)
Patient ID: Mariah Arias, female   DOB: 1924-08-02, 82 y.o.   MRN: [id number]  Location:  [place] of Service:  SNF (31)  Provider: Dinah Ngetich FNP-C  Blanchie Serve, MD   PCP: Noe Gens, MD Patient Care Team: Noe Gens, MD as PCP - General (Internal Medicine)  Extended Emergency Contact Information Primary Emergency Contact: [facility] Address: [address]          [place], [place] [address] [place] Phone: [phone] Mobile Phone: [phone] Relation: Son  Code Status: DNR  Goals of care:  Advanced Directive information Advanced Directives 05/15/2015  Does patient have an advance directive? Yes  Type of Advance Directive Out of facility DNR (pink MOST or yellow form)  Does patient want to make changes to advanced directive? No - Patient declined  Copy of advanced directive(s) in chart? Yes     No Known Allergies  Chief Complaint  Patient presents with  . Discharge Note    HPI:  82 y.o. female  Seen today at [facility] for discharge home.She was here for short term rehabilitation post hospital admission from 05/06/15-05/12/15 with acute encephalopathy post fall in setting of urinary tract infection, seizure and adrenal insufficiency. She had recurrent hypothermia and cortisol level resulted low. She was treated with antibiotics, keppra and iv steroid.She has worked with PT/OT now stable for discharge home. She will be discharge home with PT/OT to continue with ROM, Exercise and muscle strengthening. She does not require any DME. Home health service will be arranged by facility social worker prior to discharge. Unable to obtain HPI and ROS due confused.     Past Medical History  Diagnosis Date  . Complicated UTI (urinary tract infection) 05/07/2015  . Seizure disorder as sequela of cerebrovascular accident ([place]) 05/07/2015    No past surgical history on file.   has no tobacco, alcohol, and drug history on file. Social History   Social History  . Marital Status: Single    Spouse Name: N/A  . Number of Children: N/A  . Years of Education: N/A   Occupational History  . Not on file.   Social History Main Topics  . Smoking status: Unknown If Ever Smoked  . Smokeless tobacco: Not on file  . Alcohol Use: Not on file  . Drug Use: Not on file  . Sexual Activity: Not on file   Other Topics Concern  . Not on file   Social History Narrative   Functional Status Survey:    No Known Allergies  Pertinent  Health Maintenance Due  Topic Date Due  . DEXA SCAN  10/26/1991  . PNA vac Low Risk Adult (1 of 2 - PCV13) 10/26/1991  . INFLUENZA VACCINE  09/19/2015    Medications:   Medication List       This list is accurate as of: 05/23/15  8:02 PM.  Always use your most recent med list.               acetaminophen 650 MG CR tablet  Commonly known as:  TYLENOL  Take 650 mg by mouth every 8 (eight) hours as needed for pain.     docusate sodium 100 MG capsule  Commonly known as:  COLACE  Take 100 mg by mouth 2 (two) times daily as needed for mild constipation.     hydrocortisone 20 MG tablet  Commonly known as:  CORTEF  Take 1 tablet (20 mg  total) by mouth daily. Take at 6 AM daily.     hydrocortisone 10 MG tablet  Commonly known as:  CORTEF  Take 1 tablet (10 mg total) by mouth daily. Take at 4 PM daily (or 4-6 hours before bedtime).     levETIRAcetam 750 MG tablet  Commonly known as:  KEPPRA  Take 2 tablets (1,500 mg total) by mouth 2 (two) times daily.     nitrofurantoin (macrocrystal-monohydrate) 100 MG capsule  Commonly known as:  MACROBID  Take 100 mg by mouth daily.        Review of Systems  Filed Vitals:   05/23/15 1130  BP: 144/75  Pulse: 62  Temp: 97.3 F (36.3 C)  Resp: 20  Height: 5\' 7"  (1.702 m)  Weight: 164 lb 9.6 oz (74.662 kg)  SpO2: 96%   Body mass index is 25.77 kg/(m^2). Physical Exam    Constitutional: She appears well-developed and well-nourished. No distress.  Elderly confused  HENT:  Head: Normocephalic.  Mouth/Throat: Oropharynx is clear and moist.  Eyes: Conjunctivae and EOM are normal. Pupils are equal, round, and reactive to light. Right eye exhibits no discharge. Left eye exhibits no discharge. No scleral icterus.  Neck: Normal range of motion. No JVD present. No thyromegaly present.  Cardiovascular: Normal rate, regular rhythm, normal heart sounds and intact distal pulses.  Exam reveals no gallop and no friction rub.   No murmur heard. Pulmonary/Chest: Effort normal and breath sounds normal. No respiratory distress. She has no wheezes. She has no rales.  Abdominal: Soft. Bowel sounds are normal. She exhibits no distension. There is no tenderness. There is no rebound and no guarding.  Musculoskeletal: She exhibits no edema or tenderness.  Uses wheelchair   Lymphadenopathy:    She has no cervical adenopathy.  Neurological: She is alert.  Skin: Skin is warm and dry. No rash noted. No erythema. No pallor.  Psychiatric: She has a normal mood and affect.    Labs reviewed: Basic Metabolic Panel:  Recent Labs  05/06/15 1859  05/08/15 0340 05/09/15 0744 05/10/15 05/10/15 0420  NA  --   < > 145 145 140 140  K  --   < > 3.9 3.8  --  3.6  CL  --   < > 114* 113*  --  111  CO2  --   < > 22 23  --  24  GLUCOSE  --   < > 108* 122*  --  97  BUN  --   < > 10 8 10 10   CREATININE  --   < > 1.21* 1.02* 0.9 0.90  CALCIUM  --   < > 8.4* 8.8*  --  8.9  MG 2.2  --   --   --   --   --   PHOS 3.2  --   --   --   --   --   < > = values in this interval not displayed. Liver Function Tests:  Recent Labs  05/06/15 1448  AST 49*  ALT 37  ALKPHOS 157*  BILITOT 0.2*  PROT 7.0  ALBUMIN 3.6   No results for input(s): LIPASE, AMYLASE in the last 8760 hours. No results for input(s): AMMONIA in the last 8760 hours. CBC:  Recent Labs  05/06/15 1448  05/07/15 0313  05/08/15 0340 05/12/15 05/12/15 0921  WBC 5.4  --  4.6 3.6* 3.7 3.7*  NEUTROABS 4.1  --   --   --   --   --  HGB 13.0  < > 12.3 10.4*  --  11.7*  HCT 38.6  < > 35.2* 31.4*  --  34.7*  MCV 86.2  --  84.4 86.5  --  86.1  PLT 151  --  158 133*  --  137*  < > = values in this interval not displayed. Cardiac Enzymes:  Recent Labs  05/06/15 1859  TROPONINI <0.03   BNP: Invalid input(s): POCBNP CBG:  Recent Labs  05/11/15 1750 05/12/15 0016 05/12/15 1216  GLUCAP 98 93 89    Procedures and Imaging Studies During Stay: Ct Head Wo Contrast  05/06/2015  CLINICAL DATA:  Right facial swelling, un known history, likely fall EXAM: CT HEAD WITHOUT CONTRAST CT MAXILLOFACIAL WITHOUT CONTRAST CT CERVICAL SPINE WITHOUT CONTRAST TECHNIQUE: Multidetector CT imaging of the head, cervical spine, and maxillofacial structures were performed using the standard protocol without intravenous contrast. Multiplanar CT image reconstructions of the cervical spine and maxillofacial structures were also generated. COMPARISON:  None. FINDINGS: CT HEAD FINDINGS Bony calvarium is intact.  No gross soft tissue abnormality is seen. Mild atrophic changes are seen. Areas of prior ischemia are noted in the right frontoparietal region. There are tiny areas of subarachnoid hemorrhage identified near the vertex bilaterally No ventricular component is noted. No acute infarct is noted. CT MAXILLOFACIAL FINDINGS The bony structures of face appear intact without evidence of acute fracture. The paranasal sinuses are within normal limits. The orbits and their contents are unremarkable. Mild soft tissue swelling is noted over the right cheek. The central incisor on the right is not present and appears to be of an acute nature. Correlation with the physical exam is recommended. Dental caries are seen. No other definitive acute tooth loss is seen. No other soft tissue abnormality is noted. CT CERVICAL SPINE FINDINGS Seven cervical segments  are well visualized. Vertebral body height is well maintained. Multilevel osteophytic changes are seen. Facet hypertrophic changes are noted as well. No findings to suggest acute fracture or acute facet abnormality are seen. Visualized soft tissues are within normal limits. Mild carotid calcifications are seen. The lung apices are unremarkable as visualized. IMPRESSION: CT of the head: Changes consistent with mild subarachnoid hemorrhage along the vertex bilaterally. Prior right frontoparietal infarct. CT of the maxillofacial bones: No acute fracture is noted. There appears to be an acutely missing tooth involving the maxillary central incisor on the right. Clinical correlation is recommended. CT of the cervical spine: Multilevel degenerative change without acute abnormality. Critical Value/emergent results were called by telephone at the time of interpretation on 05/06/2015 at 3:38 pm to Dr. Rhina Brackett, who verbally acknowledged these results. Electronically Signed   By: Inez Catalina M.D.   On: 05/06/2015 15:41   Ct Cervical Spine Wo Contrast  05/06/2015  CLINICAL DATA:  Right facial swelling, un known history, likely fall EXAM: CT HEAD WITHOUT CONTRAST CT MAXILLOFACIAL WITHOUT CONTRAST CT CERVICAL SPINE WITHOUT CONTRAST TECHNIQUE: Multidetector CT imaging of the head, cervical spine, and maxillofacial structures were performed using the standard protocol without intravenous contrast. Multiplanar CT image reconstructions of the cervical spine and maxillofacial structures were also generated. COMPARISON:  None. FINDINGS: CT HEAD FINDINGS Bony calvarium is intact.  No gross soft tissue abnormality is seen. Mild atrophic changes are seen. Areas of prior ischemia are noted in the right frontoparietal region. There are tiny areas of subarachnoid hemorrhage identified near the vertex bilaterally No ventricular component is noted. No acute infarct is noted. CT MAXILLOFACIAL FINDINGS The bony structures of face appear  intact  without evidence of acute fracture. The paranasal sinuses are within normal limits. The orbits and their contents are unremarkable. Mild soft tissue swelling is noted over the right cheek. The central incisor on the right is not present and appears to be of an acute nature. Correlation with the physical exam is recommended. Dental caries are seen. No other definitive acute tooth loss is seen. No other soft tissue abnormality is noted. CT CERVICAL SPINE FINDINGS Seven cervical segments are well visualized. Vertebral body height is well maintained. Multilevel osteophytic changes are seen. Facet hypertrophic changes are noted as well. No findings to suggest acute fracture or acute facet abnormality are seen. Visualized soft tissues are within normal limits. Mild carotid calcifications are seen. The lung apices are unremarkable as visualized. IMPRESSION: CT of the head: Changes consistent with mild subarachnoid hemorrhage along the vertex bilaterally. Prior right frontoparietal infarct. CT of the maxillofacial bones: No acute fracture is noted. There appears to be an acutely missing tooth involving the maxillary central incisor on the right. Clinical correlation is recommended. CT of the cervical spine: Multilevel degenerative change without acute abnormality. Critical Value/emergent results were called by telephone at the time of interpretation on 05/06/2015 at 3:38 pm to Dr. Rhina Brackett, who verbally acknowledged these results. Electronically Signed   By: Inez Catalina M.D.   On: 05/06/2015 15:41   Dg Chest Portable 1 View  05/06/2015  CLINICAL DATA:  Recent fall, initial encounter EXAM: PORTABLE CHEST 1 VIEW COMPARISON:  None. FINDINGS: Cardiac shadow is within normal limits. The lungs are well aerated bilaterally. No pneumothorax or effusion is seen. No acute bony abnormality is noted. IMPRESSION: No acute abnormality seen. Electronically Signed   By: Inez Catalina M.D.   On: 05/06/2015 15:44   Dg Hand Complete  Left  05/06/2015  CLINICAL DATA:  Recent fall.  Hand swelling. EXAM: LEFT HAND - COMPLETE 3+ VIEW COMPARISON:  None FINDINGS: Pulse oximeter on the tip of the index finger. Negative for a fracture or dislocation. Alignment of the hand is normal. Soft tissues are unremarkable. Mild joint space narrowing in the interpharyngeal joints. IMPRESSION: No acute abnormality to the left hand. Electronically Signed   By: Markus Daft M.D.   On: 05/06/2015 15:44   Ct Maxillofacial Wo Cm  05/06/2015  CLINICAL DATA:  Right facial swelling, un known history, likely fall EXAM: CT HEAD WITHOUT CONTRAST CT MAXILLOFACIAL WITHOUT CONTRAST CT CERVICAL SPINE WITHOUT CONTRAST TECHNIQUE: Multidetector CT imaging of the head, cervical spine, and maxillofacial structures were performed using the standard protocol without intravenous contrast. Multiplanar CT image reconstructions of the cervical spine and maxillofacial structures were also generated. COMPARISON:  None. FINDINGS: CT HEAD FINDINGS Bony calvarium is intact.  No gross soft tissue abnormality is seen. Mild atrophic changes are seen. Areas of prior ischemia are noted in the right frontoparietal region. There are tiny areas of subarachnoid hemorrhage identified near the vertex bilaterally No ventricular component is noted. No acute infarct is noted. CT MAXILLOFACIAL FINDINGS The bony structures of face appear intact without evidence of acute fracture. The paranasal sinuses are within normal limits. The orbits and their contents are unremarkable. Mild soft tissue swelling is noted over the right cheek. The central incisor on the right is not present and appears to be of an acute nature. Correlation with the physical exam is recommended. Dental caries are seen. No other definitive acute tooth loss is seen. No other soft tissue abnormality is noted. CT CERVICAL SPINE FINDINGS Seven cervical segments  are well visualized. Vertebral body height is well maintained. Multilevel osteophytic  changes are seen. Facet hypertrophic changes are noted as well. No findings to suggest acute fracture or acute facet abnormality are seen. Visualized soft tissues are within normal limits. Mild carotid calcifications are seen. The lung apices are unremarkable as visualized. IMPRESSION: CT of the head: Changes consistent with mild subarachnoid hemorrhage along the vertex bilaterally. Prior right frontoparietal infarct. CT of the maxillofacial bones: No acute fracture is noted. There appears to be an acutely missing tooth involving the maxillary central incisor on the right. Clinical correlation is recommended. CT of the cervical spine: Multilevel degenerative change without acute abnormality. Critical Value/emergent results were called by telephone at the time of interpretation on 05/06/2015 at 3:38 pm to Dr. Rhina Brackett, who verbally acknowledged these results. Electronically Signed   By: Inez Catalina M.D.   On: 05/06/2015 15:41    Assessment/Plan:   1. Adrenal insufficiency ([place]) post hospital admission from 05/06/15-05/12/15 with acute encephalopathy post fall in setting of urinary tract infection, seizure and adrenal insufficiency. She had recurrent hypothermia and cortisol level resulted low. She was treated with antibiotics, keppra and iv steroid. Continue Hydrocortisone 20 mg Tablet in the morning and 10 mg tablet at 4PM. Cortisol level in 1-2 weeks with PCP 2. Seizure disorder ([facility]) No recent seizure episodes.Continue with with Keppra 750 mg tablet.   3. Recurrent urinary tract infection Treated with antibiotics. Continue to monitor.  4. Slow transit constipation Continue with colace.   5. Generalized weakness  Status post hospital admission from 05/06/15-05/12/15 with acute encephalopathy post fall in setting of urinary tract infection, seizure and adrenal insufficiency.Has worked well with PT/OT. Discharge home with Home health PT/OT continue with ROM, Exercise and muscle strengthening. She does not  require any DME.Fall and safety precaution.      Patient is being discharged with the following home health services:    PT/OT for ROM, Exercise and muscle strengthening.  Patient is being discharged with the following durable medical equipment:    No DME  Rx written X 1 month supply.   Patient has been advised to f/u with their PCP in 1-2 weeks to bring them up to date on their rehab stay.  Social services at facility was responsible for arranging this appointment.  Pt was provided with a 30 day supply of prescriptions for medications and refills must be obtained from their PCP.For controlled substances, a more limited supply may be provided adequate until PCP appointment only.  Future labs/tests needed:  CBC, BMP and cortisol level in 1-2 weeks  with PCP

## 2015-09-21 ENCOUNTER — Other Ambulatory Visit: Payer: Self-pay | Admitting: Internal Medicine

## 2015-12-25 ENCOUNTER — Emergency Department (HOSPITAL_COMMUNITY): Payer: Medicare Other

## 2015-12-25 ENCOUNTER — Telehealth: Payer: Self-pay | Admitting: Internal Medicine

## 2015-12-25 ENCOUNTER — Inpatient Hospital Stay (HOSPITAL_COMMUNITY)
Admission: EM | Admit: 2015-12-25 | Discharge: 2015-12-30 | DRG: 872 | Disposition: A | Discharge status: Hospice | Payer: Medicare Other | Attending: Internal Medicine | Admitting: Internal Medicine

## 2015-12-25 ENCOUNTER — Encounter (HOSPITAL_COMMUNITY): Payer: Self-pay | Admitting: Emergency Medicine

## 2015-12-25 DIAGNOSIS — A419 Sepsis, unspecified organism: Principal | ICD-10-CM | POA: Diagnosis present

## 2015-12-25 DIAGNOSIS — N179 Acute kidney failure, unspecified: Secondary | ICD-10-CM | POA: Diagnosis present

## 2015-12-25 DIAGNOSIS — R64 Cachexia: Secondary | ICD-10-CM | POA: Diagnosis present

## 2015-12-25 DIAGNOSIS — Z6822 Body mass index (BMI) 22.0-22.9, adult: Secondary | ICD-10-CM | POA: Diagnosis not present

## 2015-12-25 DIAGNOSIS — E2740 Unspecified adrenocortical insufficiency: Secondary | ICD-10-CM | POA: Diagnosis present

## 2015-12-25 DIAGNOSIS — R001 Bradycardia, unspecified: Secondary | ICD-10-CM | POA: Diagnosis not present

## 2015-12-25 DIAGNOSIS — Z7189 Other specified counseling: Secondary | ICD-10-CM

## 2015-12-25 DIAGNOSIS — I959 Hypotension, unspecified: Secondary | ICD-10-CM | POA: Diagnosis present

## 2015-12-25 DIAGNOSIS — F0390 Unspecified dementia without behavioral disturbance: Secondary | ICD-10-CM | POA: Diagnosis present

## 2015-12-25 DIAGNOSIS — Z66 Do not resuscitate: Secondary | ICD-10-CM | POA: Diagnosis present

## 2015-12-25 DIAGNOSIS — N3000 Acute cystitis without hematuria: Secondary | ICD-10-CM | POA: Diagnosis not present

## 2015-12-25 DIAGNOSIS — Z515 Encounter for palliative care: Secondary | ICD-10-CM | POA: Diagnosis not present

## 2015-12-25 DIAGNOSIS — T68XXXA Hypothermia, initial encounter: Secondary | ICD-10-CM | POA: Diagnosis not present

## 2015-12-25 DIAGNOSIS — R680 Hypothermia, not associated with low environmental temperature: Secondary | ICD-10-CM | POA: Diagnosis present

## 2015-12-25 DIAGNOSIS — R739 Hyperglycemia, unspecified: Secondary | ICD-10-CM | POA: Diagnosis present

## 2015-12-25 DIAGNOSIS — N390 Urinary tract infection, site not specified: Secondary | ICD-10-CM | POA: Diagnosis present

## 2015-12-25 DIAGNOSIS — Z79899 Other long term (current) drug therapy: Secondary | ICD-10-CM | POA: Diagnosis not present

## 2015-12-25 DIAGNOSIS — Z993 Dependence on wheelchair: Secondary | ICD-10-CM | POA: Diagnosis not present

## 2015-12-25 DIAGNOSIS — G40909 Epilepsy, unspecified, not intractable, without status epilepticus: Secondary | ICD-10-CM | POA: Diagnosis present

## 2015-12-25 DIAGNOSIS — I69398 Other sequelae of cerebral infarction: Secondary | ICD-10-CM

## 2015-12-25 DIAGNOSIS — R4182 Altered mental status, unspecified: Secondary | ICD-10-CM | POA: Diagnosis present

## 2015-12-25 HISTORY — DX: Traumatic subdural hemorrhage with loss of consciousness of unspecified duration, initial encounter: S06.5X9A

## 2015-12-25 HISTORY — DX: Traumatic subdural hemorrhage with loss of consciousness status unknown, initial encounter: S06.5XAA

## 2015-12-25 HISTORY — DX: Nontraumatic intracerebral hemorrhage, unspecified: I61.9

## 2015-12-25 HISTORY — DX: Unspecified adrenocortical insufficiency: E27.40

## 2015-12-25 LAB — COMPREHENSIVE METABOLIC PANEL
ALK PHOS: 134 U/L — AB (ref 38–126)
ALT: 25 U/L (ref 14–54)
ALT: 24 U/L (ref 14–54)
AST: 39 U/L (ref 15–41)
AST: 33 U/L (ref 15–41)
Albumin: 3.2 g/dL — ABNORMAL LOW (ref 3.5–5.0)
Albumin: 2.7 g/dL — ABNORMAL LOW (ref 3.5–5.0)
Alkaline Phosphatase: 167 U/L — ABNORMAL HIGH (ref 38–126)
Anion gap: 10 (ref 5–15)
Anion gap: 7 (ref 5–15)
BILIRUBIN TOTAL: 0.7 mg/dL (ref 0.3–1.2)
BUN: 15 mg/dL (ref 6–20)
BUN: 15 mg/dL (ref 6–20)
CALCIUM: 8.7 mg/dL — AB (ref 8.9–10.3)
CHLORIDE: 104 mmol/L (ref 101–111)
CHLORIDE: 109 mmol/L (ref 101–111)
CO2: 31 mmol/L (ref 22–32)
CO2: 27 mmol/L (ref 22–32)
CREATININE: 1.34 mg/dL — AB (ref 0.44–1.00)
CREATININE: 1.16 mg/dL — AB (ref 0.44–1.00)
Calcium: 9.9 mg/dL (ref 8.9–10.3)
GFR calc non Af Amer: 34 mL/min — ABNORMAL LOW (ref >60)
GFR, EST AFRICAN AMERICAN: 39 mL/min — AB (ref >60)
GFR, EST AFRICAN AMERICAN: 47 mL/min — AB (ref >60)
GFR, EST NON AFRICAN AMERICAN: 40 mL/min — AB (ref >60)
Glucose, Bld: 165 mg/dL — ABNORMAL HIGH (ref 65–99)
Glucose, Bld: 134 mg/dL — ABNORMAL HIGH (ref 65–99)
POTASSIUM: 4.7 mmol/L (ref 3.5–5.1)
Potassium: 4.1 mmol/L (ref 3.5–5.1)
SODIUM: 145 mmol/L (ref 135–145)
Sodium: 143 mmol/L (ref 135–145)
Total Bilirubin: 0.7 mg/dL (ref 0.3–1.2)
Total Protein: 6.7 g/dL (ref 6.5–8.1)
Total Protein: 5.4 g/dL — ABNORMAL LOW (ref 6.5–8.1)

## 2015-12-25 LAB — CBC WITH DIFFERENTIAL/PLATELET
Basophils Absolute: 0 10*3/uL (ref 0.0–0.1)
Basophils Relative: 0 %
EOS ABS: 0 10*3/uL (ref 0.0–0.7)
Eosinophils Relative: 1 %
HEMATOCRIT: 43.3 % (ref 36.0–46.0)
HEMOGLOBIN: 14.9 g/dL (ref 12.0–15.0)
LYMPHS ABS: 1.4 10*3/uL (ref 0.7–4.0)
LYMPHS PCT: 28 %
MCH: 30.1 pg (ref 26.0–34.0)
MCHC: 34.4 g/dL (ref 30.0–36.0)
MCV: 87.5 fL (ref 78.0–100.0)
MONOS PCT: 12 %
Monocytes Absolute: 0.6 10*3/uL (ref 0.1–1.0)
NEUTROS PCT: 59 %
Neutro Abs: 3 10*3/uL (ref 1.7–7.7)
Platelets: 215 10*3/uL (ref 150–400)
RBC: 4.95 MIL/uL (ref 3.87–5.11)
RDW: 15.8 % — ABNORMAL HIGH (ref 11.5–15.5)
WBC: 5.1 10*3/uL (ref 4.0–10.5)

## 2015-12-25 LAB — URINALYSIS, ROUTINE W REFLEX MICROSCOPIC
BILIRUBIN URINE: NEGATIVE
GLUCOSE, UA: NEGATIVE mg/dL
Ketones, ur: NEGATIVE mg/dL
Nitrite: NEGATIVE
PH: 6 (ref 5.0–8.0)
Protein, ur: 30 mg/dL — AB
SPECIFIC GRAVITY, URINE: 1.013 (ref 1.005–1.030)

## 2015-12-25 LAB — I-STAT CG4 LACTIC ACID, ED
LACTIC ACID, VENOUS: 3.54 mmol/L — AB (ref 0.5–1.9)
LACTIC ACID, VENOUS: 1.18 mmol/L (ref 0.5–1.9)
Lactic Acid, Venous: 1.92 mmol/L (ref 0.5–1.9)

## 2015-12-25 LAB — URINE MICROSCOPIC-ADD ON

## 2015-12-25 LAB — CORTISOL: Cortisol, Plasma: 7.8 ug/dL

## 2015-12-25 LAB — TSH: TSH: 1.177 u[IU]/mL (ref 0.350–4.500)

## 2015-12-25 MED ORDER — PIPERACILLIN-TAZOBACTAM 3.375 G IVPB
3.3750 g | Freq: Three times a day (TID) | INTRAVENOUS | Status: DC
  Administered 2015-12-26 – 2015-12-29 (×12): 3.375 g via INTRAVENOUS
  Filled 2015-12-25 (×14): qty 50

## 2015-12-25 MED ORDER — LEVETIRACETAM 500 MG PO TABS
1000.0000 mg | ORAL_TABLET | Freq: Two times a day (BID) | ORAL | Status: DC
  Filled 2015-12-25: qty 2

## 2015-12-25 MED ORDER — PIPERACILLIN-TAZOBACTAM 3.375 G IVPB 30 MIN
3.3750 g | Freq: Once | INTRAVENOUS | Status: AC
  Administered 2015-12-25: 3.375 g via INTRAVENOUS
  Filled 2015-12-25: qty 50

## 2015-12-25 MED ORDER — VANCOMYCIN HCL IN DEXTROSE 1-5 GM/200ML-% IV SOLN
1000.0000 mg | INTRAVENOUS | Status: DC
  Administered 2015-12-26 – 2015-12-27 (×2): 1000 mg via INTRAVENOUS
  Filled 2015-12-25 (×2): qty 200

## 2015-12-25 MED ORDER — HYDROCORTISONE NA SUCCINATE PF 100 MG IJ SOLR
50.0000 mg | Freq: Three times a day (TID) | INTRAMUSCULAR | Status: DC
  Administered 2015-12-25 – 2015-12-27 (×6): 50 mg via INTRAVENOUS
  Filled 2015-12-25 (×6): qty 2

## 2015-12-25 MED ORDER — ACETAMINOPHEN 325 MG PO TABS: 650.0000 mg | ORAL_TABLET | Freq: Four times a day (QID) | ORAL | Status: DC | PRN

## 2015-12-25 MED ORDER — DEXAMETHASONE SODIUM PHOSPHATE 10 MG/ML IJ SOLN
10.0000 mg | Freq: Once | INTRAMUSCULAR | Status: AC
  Administered 2015-12-25: 10 mg via INTRAVENOUS
  Filled 2015-12-25: qty 1

## 2015-12-25 MED ORDER — SODIUM CHLORIDE 0.9 % IV SOLN
INTRAVENOUS | Status: DC
  Administered 2015-12-25 – 2015-12-29 (×5): via INTRAVENOUS

## 2015-12-25 MED ORDER — ENOXAPARIN SODIUM 40 MG/0.4ML ~~LOC~~ SOLN
40.0000 mg | SUBCUTANEOUS | Status: DC
  Administered 2015-12-26: 40 mg via SUBCUTANEOUS
  Filled 2015-12-25: qty 0.4

## 2015-12-25 MED ORDER — ACETAMINOPHEN 650 MG RE SUPP: 650.0000 mg | Freq: Four times a day (QID) | RECTAL | Status: DC | PRN

## 2015-12-25 MED ORDER — VANCOMYCIN HCL IN DEXTROSE 1-5 GM/200ML-% IV SOLN
1000.0000 mg | Freq: Once | INTRAVENOUS | Status: AC
  Administered 2015-12-25: 1000 mg via INTRAVENOUS
  Filled 2015-12-25: qty 200

## 2015-12-25 MED ORDER — DOCUSATE SODIUM 100 MG PO CAPS: 100.0000 mg | ORAL_CAPSULE | Freq: Two times a day (BID) | ORAL | Status: DC | PRN

## 2015-12-25 MED ORDER — SODIUM CHLORIDE 0.9% FLUSH
3.0000 mL | Freq: Two times a day (BID) | INTRAVENOUS | Status: DC
  Administered 2015-12-26 – 2015-12-27 (×4): 3 mL via INTRAVENOUS

## 2015-12-25 NOTE — Telephone Encounter (Signed)
Patient's son is a patient of yours- Mariah Arias  He is moving this patient up from [place], and asking that you accept her as a patient. Is this something that you are able to do?

## 2015-12-25 NOTE — ED Notes (Signed)
Family brought patient here due to weakness and "not acting right" exhibiting all signs that she has a UTI- hx of them. Patient is nonverbal with staff and looks pale. EMT unable to get oral temperature. Pt's extremeties cool and mottled.

## 2015-12-25 NOTE — Telephone Encounter (Signed)
[facility]  Patient Name: Mariah Arias  DOB: 05/01/1924    Initial Comment Mother is bothered with UTI from time to time, thinks she is experiencing one. Also has a bed sore or skin tear    Nurse Assessment  Nurse: Wynetta Emery, RN, Baker Janus Date/Time Eilene Ghazi Time): 12/25/2015 9:11:39 AM  Confirm and document reason for call. If symptomatic, describe symptoms. You must click the next button to save text entered. ---[place] stays with care giver; and [name] states the care giver called to say she has confusion and urinating more frequency; and has a small bed sore or skin tear in the bottom  Has the patient traveled out of the country within the last 30 days? ---No  Does the patient have any new or worsening symptoms? ---Yes  Will a triage be completed? ---Yes  Related visit to physician within the last 2 weeks? ---No  Does the PT have any chronic conditions? (i.e. diabetes, asthma, etc.) ---Unknown  Is this a behavioral health or substance abuse call? ---No     Guidelines    Guideline Title Affirmed Question Affirmed Notes  Urinary Symptoms [1] Can't control passage of urine (i.e., urinary incontinence) AND [2] new onset (< 2 weeks) or worsening    Final Disposition User   See Physician within 24 Hours Wynetta Emery, RN, Baker Janus    Comments  NOTE: Nurse advised that they do not see clients before they are established as patient (per office) and this Nurse can't make an appointment at this time. Advised to take to Urgent care and possibly take a sample of urine in. Please keep the 17th appt with Coplay Elam to get her established as patient.   Referrals  REFERRED TO PCP OFFICE   Disagree/Comply: Comply

## 2015-12-25 NOTE — ED Notes (Signed)
Attempted report 

## 2015-12-25 NOTE — ED Notes (Signed)
Pt remains nonverbal at this time, responds only to painful stimuli.

## 2015-12-25 NOTE — ED Provider Notes (Signed)
[facility] DEPT Provider Note   CSN: [id number] Arrival date & time: 12/25/15  1149     History   Chief Complaint Chief Complaint  Patient presents with  . Weakness  . Code Sepsis    HPI Karessa Doshi is a 82 y.o. female.Level V caveat altered mental status. Acuity of situation. History is obtained from nursing notes. Patient presents with generalized weakness and not acting right per caregiver who accompany patient to the ED and is not presently available for questioning. She is noted to be hypothermic. Upon asking patient how she feels she states "I feel fine" and "does not answer further questions.. Old records reviewed. Per prior history and physical 05/06/2015 patient had similar presentation with hypothermia.  HPI  Past Medical History:  Diagnosis Date  . Complicated UTI (urinary tract infection) 05/07/2015  . Seizure disorder as sequela of cerebrovascular accident [facility]) 05/07/2015    Patient Active Problem List   Diagnosis Date Noted  . Constipation 05/23/2015  . Recurrent urinary tract infection 05/15/2015  . Seizure disorder ([place]) 05/15/2015  . Adrenal insufficiency ([place]) 05/10/2015  . Anemia due to bone marrow failure ([place])   . Hypoglycemia, unspecified   . Body temperature low   . Complicated UTI (urinary tract infection) 05/07/2015  . Seizure disorder as sequela of cerebrovascular accident ([place]) 05/07/2015  . Acute encephalopathy   . SAH (subarachnoid hemorrhage) ([place]) 05/06/2015  . Subarachnoid bleed ([place]) 05/06/2015    History reviewed. No pertinent surgical history.  OB History    No data available       Home Medications    Prior to Admission medications   Medication Sig Start Date End Date Taking? Authorizing Provider  acetaminophen (TYLENOL) 650 MG CR tablet Take 650 mg by mouth every 8 (eight) hours as needed for pain.    Historical Provider, MD  docusate sodium (COLACE) 100 MG capsule Take 100 mg by mouth 2 (two) times daily  as needed for mild constipation.    Historical Provider, MD  hydrocortisone (CORTEF) 10 MG tablet Take 1 tablet (10 mg total) by mouth daily. Take at 4 PM daily (or 4-6 hours before bedtime). 05/12/15   Zada Finders, MD  hydrocortisone (CORTEF) 20 MG tablet Take 1 tablet (20 mg total) by mouth daily. Take at 6 AM daily. 05/12/15   Zada Finders, MD  levETIRAcetam (KEPPRA) 750 MG tablet Take 2 tablets (1,500 mg total) by mouth 2 (two) times daily. 05/11/15   Zada Finders, MD  nitrofurantoin, macrocrystal-monohydrate, (MACROBID) 100 MG capsule Take 100 mg by mouth daily.    Historical Provider, MD    Family History History reviewed. No pertinent family history.  Social History Social History  Substance Use Topics  . Smoking status: Unknown If Ever Smoked  . Smokeless tobacco: Not on file  . Alcohol use Not on file     Allergies   Patient has no known allergies.   Review of Systems Review of Systems  Unable to perform ROS: Mental status change     Physical Exam Updated Vital Signs BP 105/71 (BP Location: Right Arm)   Pulse 93   Temp (S) (!) 95.7 F (35.4 C) (Rectal)   Resp 16   Ht 5\' 7"  (1.702 m)   Wt 145 lb (65.8 kg)   SpO2 94%   BMI 22.71 kg/m   Physical Exam  Constitutional:  Chronically ill-appearing. No facial asymmetry  HENT:  Head: Normocephalic and atraumatic.  Eyes: Conjunctivae are normal. Pupils are equal, round, and reactive  to light.  Neck: Neck supple. No tracheal deviation present. No thyromegaly present.  Cardiovascular: Normal rate and regular rhythm.   No murmur heard. Pulmonary/Chest: Effort normal and breath sounds normal.  Abdominal: Soft. Bowel sounds are normal. She exhibits no distension. There is no tenderness.  Musculoskeletal: Normal range of motion. She exhibits no edema or tenderness.  Neurological: She is alert. Coordination normal.  Not follow simple commands  Skin: Skin is warm and dry. No rash noted.  Nursing note and vitals  reviewed.    ED Treatments / Results  Labs (all labs ordered are listed, but only abnormal results are displayed) Labs Reviewed  COMPREHENSIVE METABOLIC PANEL - Abnormal; Notable for the following:       Result Value   Glucose, Bld 165 (*)    Creatinine, Ser 1.34 (*)    Albumin 3.2 (*)    Alkaline Phosphatase 167 (*)    GFR calc non Af Amer 34 (*)    GFR calc Af Amer 39 (*)    All other components within normal limits  CBC WITH DIFFERENTIAL/PLATELET - Abnormal; Notable for the following:    RDW 15.8 (*)    All other components within normal limits  I-STAT CG4 LACTIC ACID, ED - Abnormal; Notable for the following:    Lactic Acid, Venous 3.54 (*)    All other components within normal limits  CULTURE, BLOOD (ROUTINE X 2)  CULTURE, BLOOD (ROUTINE X 2)  URINE CULTURE  URINALYSIS, ROUTINE W REFLEX MICROSCOPIC (NOT AT [facility])  COMPREHENSIVE METABOLIC PANEL  I-STAT CG4 LACTIC ACID, ED  Chest x-ray viewed by me . Results for orders placed or performed during the hospital encounter of 12/25/15  Comprehensive metabolic panel  Result Value Ref Range   Sodium 145 135 - 145 mmol/L   Potassium 4.7 3.5 - 5.1 mmol/L   Chloride 104 101 - 111 mmol/L   CO2 31 22 - 32 mmol/L   Glucose, Bld 165 (H) 65 - 99 mg/dL   BUN 15 6 - 20 mg/dL   Creatinine, Ser 1.34 (H) 0.44 - 1.00 mg/dL   Calcium 9.9 8.9 - 10.3 mg/dL   Total Protein 6.7 6.5 - 8.1 g/dL   Albumin 3.2 (L) 3.5 - 5.0 g/dL   AST 39 15 - 41 U/L   ALT 25 14 - 54 U/L   Alkaline Phosphatase 167 (H) 38 - 126 U/L   Total Bilirubin 0.7 0.3 - 1.2 mg/dL   GFR calc non Af Amer 34 (L) >60 mL/min   GFR calc Af Amer 39 (L) >60 mL/min   Anion gap 10 5 - 15  CBC WITH DIFFERENTIAL  Result Value Ref Range   WBC 5.1 4.0 - 10.5 K/uL   RBC 4.95 3.87 - 5.11 MIL/uL   Hemoglobin 14.9 12.0 - 15.0 g/dL   HCT 43.3 36.0 - 46.0 %   MCV 87.5 78.0 - 100.0 fL   MCH 30.1 26.0 - 34.0 pg   MCHC 34.4 30.0 - 36.0 g/dL   RDW 15.8 (H) 11.5 - 15.5 %   Platelets 215  150 - 400 K/uL   Neutrophils Relative % 59 %   Neutro Abs 3.0 1.7 - 7.7 K/uL   Lymphocytes Relative 28 %   Lymphs Abs 1.4 0.7 - 4.0 K/uL   Monocytes Relative 12 %   Monocytes Absolute 0.6 0.1 - 1.0 K/uL   Eosinophils Relative 1 %   Eosinophils Absolute 0.0 0.0 - 0.7 K/uL   Basophils Relative 0 %  Basophils Absolute 0.0 0.0 - 0.1 K/uL  Urinalysis, Routine w reflex microscopic (not at [facility])  Result Value Ref Range   Color, Urine YELLOW YELLOW   APPearance TURBID (A) CLEAR   Specific Gravity, Urine 1.013 1.005 - 1.030   pH 6.0 5.0 - 8.0   Glucose, UA NEGATIVE NEGATIVE mg/dL   Hgb urine dipstick MODERATE (A) NEGATIVE   Bilirubin Urine NEGATIVE NEGATIVE   Ketones, ur NEGATIVE NEGATIVE mg/dL   Protein, ur 30 (A) NEGATIVE mg/dL   Nitrite NEGATIVE NEGATIVE   Leukocytes, UA LARGE (A) NEGATIVE  Urine microscopic-add on  Result Value Ref Range   Squamous Epithelial / LPF 6-30 (A) NONE SEEN   WBC, UA TOO NUMEROUS TO COUNT 0 - 5 WBC/hpf   RBC / HPF 6-30 0 - 5 RBC/hpf   Bacteria, UA FEW (A) NONE SEEN  I-Stat CG4 Lactic Acid, ED  (not at  [facility])  Result Value Ref Range   Lactic Acid, Venous 3.54 (HH) 0.5 - 1.9 mmol/L   Comment NOTIFIED PHYSICIAN   I-Stat CG4 Lactic Acid, ED  (not at  [facility])  Result Value Ref Range   Lactic Acid, Venous 1.92 (HH) 0.5 - 1.9 mmol/L   Dg Chest Port 1 View  Result Date: 12/25/2015 CLINICAL DATA:  Sepsis, possible urinary tract infection. History of previous CVA. EXAM: PORTABLE CHEST 1 VIEW COMPARISON:  Portable chest x-ray of May 06, 2015 FINDINGS: The lungs are adequately inflated and clear. The heart and pulmonary vascularity are normal. The mediastinum is normal in width. The bony thorax exhibits no acute abnormality. IMPRESSION: There is no active cardiopulmonary disease. Electronically Signed   By: David  Martinique M.D.   On: 12/25/2015 13:29  ED ECG REPORT   Date: 12/25/2015  Rate: 75  Rhythm: normal sinus rhythm  QRS Axis: normal  Intervals: normal   ST/T Wave abnormalities: nonspecific T wave changes  Conduction Disutrbances:none  Narrative Interpretation:   Old EKG Reviewed: unchanged  I have personally reviewed the EKG tracing and agree with the computerized printout as noted.  EKG  EKG Interpretation None     Chest x-ray viewed by me  Radiology No results found.  Procedures Procedures (including critical care time)  Medications Ordered in ED Medications  piperacillin-tazobactam (ZOSYN) IVPB 3.375 g (not administered)  vancomycin (VANCOCIN) IVPB 1000 mg/200 mL premix (not administered)    Placed on bair hugger for hypothermia. IV antibiotics administered. Also received IV dexamethasone as has history of renal insufficiency. Further history from patient's son who subsequently arrived patient's mental status is at baseline. She is DO NOT RESUSCITATE CODE STATUS. He agrees with palate of care consult which I have ordered Initial Impression / Assessment and Plan / ED Course  I have reviewed the triage vital signs and the nursing notes.  Pertinent labs & imaging results that were available during my care of the patient were reviewed by me and considered in my medical decision making (see chart for details).  Clinical Course   Code sepsis called based on Sirs criteria hypothermia, altered mental status  Patient's son arrived. Patient is DO NOT RESUSCITATE status. Her primary care physician is in [place]. Dr. Eliseo Squires was consulted and will see patient in ED. Patient 's exam essentially unchanged after treatment with intravenous antibiotics and intravenous Decadron. She remains hypothermic. IV normal saline ordered at 100 mL per hour, warmed fluids Final Clinical Impressions(s) / ED Diagnoses  Diagnosis #1 sepsis #2 hypothermia #3 acute kidney injury #4urinary tract infection #5 hyperglycemia  Final diagnoses:  Hypothermia   CRITICAL CARE Performed by: Orlie Dakin Total critical care time: 30 minutes Critical care  time was exclusive of separately billable procedures and treating other patients. Critical care was necessary to treat or prevent imminent or life-threatening deterioration. Critical care was time spent personally by me on the following activities: development of treatment plan with patient and/or surrogate as well as nursing, discussions with consultants, evaluation of patient's response to treatment, examination of patient, obtaining history from patient or surrogate, ordering and performing treatments and interventions, ordering and review of laboratory studies, ordering and review of radiographic studies, pulse oximetry and re-evaluation of patient's condition. New Prescriptions New Prescriptions   No medications on file     Orlie Dakin, MD 12/25/15 1631

## 2015-12-25 NOTE — Telephone Encounter (Signed)
Ok with me 

## 2015-12-25 NOTE — ED Notes (Signed)
Mariah Arias, son, can be reached with further questions/concerns at [phone] (cell) or [phone] (home).

## 2015-12-25 NOTE — ED Notes (Signed)
Discussed hypotension w/ Dr. Winfred Leeds. MAP ok, will not order fluids at this time and will continue to monitor pt.

## 2015-12-25 NOTE — ED Notes (Signed)
Placed pt on the right side and placed a pressure ulcer pad on pt bottom

## 2015-12-25 NOTE — Progress Notes (Signed)
Pharmacy Antibiotic Note  Mariah Arias is a 82 y.o. female admitted on 12/25/2015 with sepsis.  Pharmacy has been consulted for vancomycin and zosyn dosing.  Wt 65.8 kg, WBC 5.1, creat 1.34, lactate 3.54, hypothermic at 95.7 Suspected UTI. CXR 11/6 negative.    Plan: Vancomycin 1000 IV every 24 hours.  Goal trough 15-20 mcg/mL.  Zosyn 3.375 mg IV q8h, infuse each dose over 4 hours F/u renal fxn, wbc, temp, culture data Vancomycin trough as needed  Height: 5\' 7"  (170.2 cm) Weight: 145 lb (65.8 kg) IBW/kg (Calculated) : 61.6  Temp (24hrs), Avg:95.6 F (35.3 C), Min:95.5 F (35.3 C), Max:95.7 F (35.4 C)   Recent Labs Lab 12/25/15 1218 12/25/15 1237  WBC 5.1  --   CREATININE 1.34*  --   LATICACIDVEN  --  3.54*    Estimated Creatinine Clearance: 27.7 mL/min (by C-G formula based on SCr of 1.34 mg/dL (H)).    No Known Allergies  Eudelia Bunch, Pharm.D. [id number] 12/25/2015 1:46 PM

## 2015-12-25 NOTE — Telephone Encounter (Signed)
Pt's son call back to schedule with Baldo Ash. Cancel this request.

## 2015-12-25 NOTE — H&P (Signed)
History and Physical    Mariah Arias [id number] DOB: 07/21/1924 DOA: 12/25/2015  Referring MD/NP/PA:  PCP: Noe Gens, MD Outpatient Specialists:  Patient coming from: home (lives with caregiver and 1 other patient in caregiver's home)  Chief Complaint: AMS  HPI: Mariah Arias is a 82 y.o. female with medical history significant of UTI, dementia, seizures.  Admitted to teaching service in March with urosepsis. Family states that over the last few months, patient has not been getting around as well as before-- requiring a wheelchair instead of walker.  She has also been unable to feed self for last month.  Caregiver contacted son stating that patient has been more confused and not "acting like herself".   In the ER, she was found to he hypothermic, have a UTI and be septic from that. Nursing aid in room states cathed urine appeared to be purulent.   She was started on bair hugger, given dose of IV decadron-- for h/o adrenal insuff.  Patient's family stated that she is a DNR and they were open to palliative care discussions if patient were not to improve.  Review of Systems: all systems reviewed, negative unless stated above in HPI   Past Medical History:  Diagnosis Date  . Complicated UTI (urinary tract infection) 05/07/2015  . CVA (cerebrovascular accident due to intracerebral hemorrhage) ([place])   . Seizure disorder as sequela of cerebrovascular accident ([place]) 05/07/2015  . Subdural hematoma (H[facility])     History reviewed. No pertinent surgical history.  Does not use tobacco, alcohol or illeagal drugs Lives with caregiver-- recently has not been walking but instead using wheelchair, unable to feed self  No Known Allergies  History reviewed. No pertinent family history.   Prior to Admission medications   Medication Sig Start Date End Date Taking? Authorizing Provider  hydrocortisone (CORTEF) 20 MG tablet Take 1 tablet (20 mg total) by mouth  daily. Take at 6 AM daily. 05/12/15  Yes Zada Finders, MD  levETIRAcetam (KEPPRA) 750 MG tablet Take 2 tablets (1,500 mg total) by mouth 2 (two) times daily. Patient taking differently: Take 1,000 mg by mouth 2 (two) times daily.  05/11/15  Yes Zada Finders, MD  Menthol, Topical Analgesic, (BENGAY EX) Apply 1 application topically as needed (for pain).   Yes Historical Provider, MD  neomycin-bacitracin-polymyxin (NEOSPORIN) ointment Apply 1 application topically as needed for wound care.   Yes Historical Provider, MD  nitrofurantoin, macrocrystal-monohydrate, (MACROBID) 100 MG capsule Take 100 mg by mouth daily.   Yes Historical Provider, MD  acetaminophen (TYLENOL) 650 MG CR tablet Take 650 mg by mouth every 8 (eight) hours as needed for pain.    Historical Provider, MD  docusate sodium (COLACE) 100 MG capsule Take 100 mg by mouth 2 (two) times daily as needed for mild constipation.    Historical Provider, MD  hydrocortisone (CORTEF) 10 MG tablet Take 1 tablet (10 mg total) by mouth daily. Take at 4 PM daily (or 4-6 hours before bedtime). Patient not taking: Reported on 12/25/2015 05/12/15   Zada Finders, MD    Physical Exam: Vitals:   12/25/15 1515 12/25/15 1545 12/25/15 1559 12/25/15 1615  BP: 110/63 109/65  117/67  Pulse: 74 62  77  Resp: 10 14  15   Temp:   (S) (!) 94.5 F (34.7 C)   TempSrc:   (S) Rectal   SpO2: 98% 98%  99%  Weight:      Height:          Constitutional: NAD,  calm, comfortable Vitals:   12/25/15 1515 12/25/15 1545 12/25/15 1559 12/25/15 1615  BP: 110/63 109/65  117/67  Pulse: 74 62  77  Resp: 10 14  15   Temp:   (S) (!) 94.5 F (34.7 C)   TempSrc:   (S) Rectal   SpO2: 98% 98%  99%  Weight:      Height:       Eyes: senile changed ENMT: Mucous membranes are dry. Posterior pharynx clear of any exudate or lesions Neck: normal, supple, no masses, no thyromegaly Respiratory: clear to auscultation bilaterally, no wheezing, no crackles. Normal respiratory effort.  No accessory muscle use.  Cardiovascular: Regular rate and rhythm, no murmurs / rubs / gallops. No extremity edema. 2+ pedal pulses. No carotid bruits.  Abdomen: no tenderness, no masses palpated. No hepatosplenomegaly. Bowel sounds positive.  Musculoskeletal: moves all 4 ext Neurologic: unable to cooperate for ecam Psychiatric: able to state name, follow simple commands    Labs on Admission: I have personally reviewed following labs and imaging studies  CBC:  Recent Labs Lab 12/25/15 1218  WBC 5.1  NEUTROABS 3.0  HGB 14.9  HCT 43.3  MCV 87.5  PLT 123456   Basic Metabolic Panel:  Recent Labs Lab 12/25/15 1218 12/25/15 1525  NA 145 143  K 4.7 4.1  CL 104 109  CO2 31 27  GLUCOSE 165* 134*  BUN 15 15  CREATININE 1.34* 1.16*  CALCIUM 9.9 8.7*   GFR: Estimated Creatinine Clearance: 32 mL/min (by C-G formula based on SCr of 1.16 mg/dL (H)). Liver Function Tests:  Recent Labs Lab 12/25/15 1218 12/25/15 1525  AST 39 33  ALT 25 24  ALKPHOS 167* 134*  BILITOT 0.7 0.7  PROT 6.7 5.4*  ALBUMIN 3.2* 2.7*   No results for input(s): LIPASE, AMYLASE in the last 168 hours. No results for input(s): AMMONIA in the last 168 hours. Coagulation Profile: No results for input(s): INR, PROTIME in the last 168 hours. Cardiac Enzymes: No results for input(s): CKTOTAL, CKMB, CKMBINDEX, TROPONINI in the last 168 hours. BNP (last 3 results) No results for input(s): PROBNP in the last 8760 hours. HbA1C: No results for input(s): HGBA1C in the last 72 hours. CBG: No results for input(s): GLUCAP in the last 168 hours. Lipid Profile: No results for input(s): CHOL, HDL, LDLCALC, TRIG, CHOLHDL, LDLDIRECT in the last 72 hours. Thyroid Function Tests: No results for input(s): TSH, T4TOTAL, FREET4, T3FREE, THYROIDAB in the last 72 hours. Anemia Panel: No results for input(s): VITAMINB12, FOLATE, FERRITIN, TIBC, IRON, RETICCTPCT in the last 72 hours. Urine analysis:    Component Value  Date/Time   COLORURINE YELLOW 12/25/2015 1315   APPEARANCEUR TURBID (A) 12/25/2015 1315   LABSPEC 1.013 12/25/2015 1315   PHURINE 6.0 12/25/2015 1315   GLUCOSEU NEGATIVE 12/25/2015 1315   HGBUR MODERATE (A) 12/25/2015 1315   BILIRUBINUR NEGATIVE 12/25/2015 1315   KETONESUR NEGATIVE 12/25/2015 1315   PROTEINUR 30 (A) 12/25/2015 1315   NITRITE NEGATIVE 12/25/2015 1315   LEUKOCYTESUR LARGE (A) 12/25/2015 1315   Sepsis Labs: Invalid input(s): PROCALCITONIN, LACTICIDVEN No results found for this or any previous visit (from the past 240 hour(s)).   Radiological Exams on Admission: Dg Chest Port 1 View  Result Date: 12/25/2015 CLINICAL DATA:  Sepsis, possible urinary tract infection. History of previous CVA. EXAM: PORTABLE CHEST 1 VIEW COMPARISON:  Portable chest x-ray of May 06, 2015 FINDINGS: The lungs are adequately inflated and clear. The heart and pulmonary vascularity are normal. The mediastinum is normal  in width. The bony thorax exhibits no acute abnormality. IMPRESSION: There is no active cardiopulmonary disease. Electronically Signed   By: David  Martinique M.D.   On: 12/25/2015 13:29    EKG: Independently reviewed. sinus  Assessment/Plan Active Problems:   Adrenal insufficiency (HCC)   Seizure disorder (HCC)   Sepsis (HCC)  Sepsis due to UTI -IV vanc/zosyn given in ER -lactic acid decreasing -warm IVF -bair hugger (requiring SDU admission)  Adrenal insufficiency (hypothermic and hypotensive) -IV stress dose steroids -cortisol level -hold PO  Seizure d/o -resume home keppra  SLP eval- once patient asking for food, would start diet until seen by SLP  DVT prophylaxis: lovenox Code Status: DNR Family Communication: son at bedside Disposition Plan:  Consults called: palliative Admission status: inpt SDU   Switzer DO [facility] Pager [phone]  If 7PM-7AM, please contact night-coverage [email or web address] Password [email or web address]  12/25/2015, 5:13 PM

## 2015-12-25 NOTE — ED Notes (Signed)
Spoke with EDP notified lactic acid 3.54 charge nurse getting room assigned for patient.

## 2015-12-25 NOTE — ED Notes (Signed)
Bear hugger removed from Pt per [employer] due to the Pt's temp

## 2015-12-26 DIAGNOSIS — N390 Urinary tract infection, site not specified: Secondary | ICD-10-CM

## 2015-12-26 DIAGNOSIS — Z515 Encounter for palliative care: Secondary | ICD-10-CM

## 2015-12-26 DIAGNOSIS — A419 Sepsis, unspecified organism: Secondary | ICD-10-CM | POA: Diagnosis present

## 2015-12-26 DIAGNOSIS — T68XXXA Hypothermia, initial encounter: Secondary | ICD-10-CM

## 2015-12-26 DIAGNOSIS — Z7189 Other specified counseling: Secondary | ICD-10-CM

## 2015-12-26 LAB — URINE CULTURE

## 2015-12-26 LAB — CBC
HCT: 35.9 % — ABNORMAL LOW (ref 36.0–46.0)
HEMOGLOBIN: 12.4 g/dL (ref 12.0–15.0)
MCH: 29.5 pg (ref 26.0–34.0)
MCHC: 34.5 g/dL (ref 30.0–36.0)
MCV: 85.5 fL (ref 78.0–100.0)
PLATELETS: 195 10*3/uL (ref 150–400)
RBC: 4.2 MIL/uL (ref 3.87–5.11)
RDW: 15.4 % (ref 11.5–15.5)
WBC: 7.7 10*3/uL (ref 4.0–10.5)

## 2015-12-26 LAB — BASIC METABOLIC PANEL
ANION GAP: 11 (ref 5–15)
BUN: 17 mg/dL (ref 6–20)
CALCIUM: 8.9 mg/dL (ref 8.9–10.3)
CHLORIDE: 108 mmol/L (ref 101–111)
CO2: 25 mmol/L (ref 22–32)
CREATININE: 1.26 mg/dL — AB (ref 0.44–1.00)
GFR calc non Af Amer: 37 mL/min — ABNORMAL LOW (ref >60)
GFR, EST AFRICAN AMERICAN: 42 mL/min — AB (ref >60)
Glucose, Bld: 119 mg/dL — ABNORMAL HIGH (ref 65–99)
Potassium: 4.4 mmol/L (ref 3.5–5.1)
SODIUM: 144 mmol/L (ref 135–145)

## 2015-12-26 LAB — MRSA PCR SCREENING: MRSA BY PCR: NEGATIVE

## 2015-12-26 MED ORDER — RESOURCE THICKENUP CLEAR PO POWD
Freq: Once | ORAL | Status: AC
  Administered 2015-12-26: 12:00:00 via ORAL
  Filled 2015-12-26: qty 125

## 2015-12-26 MED ORDER — SODIUM CHLORIDE 0.9 % IV SOLN
1000.0000 mg | Freq: Once | INTRAVENOUS | Status: AC
  Administered 2015-12-26: 1000 mg via INTRAVENOUS
  Filled 2015-12-26: qty 10

## 2015-12-26 NOTE — Evaluation (Signed)
Clinical/Bedside Swallow Evaluation Patient Details  Name: Mariah Arias MRN: [id number] Date of Birth: 01/04/1925  Today's Date: 12/26/2015 Time: SLP Start Time (ACUTE ONLY): 1018 SLP Stop Time (ACUTE ONLY): 1030 SLP Time Calculation (min) (ACUTE ONLY): 12 min  Past Medical History:  Past Medical History:  Diagnosis Date  . Adrenal insufficiency ([place])   . Complicated UTI (urinary tract infection) 05/07/2015  . CVA (cerebrovascular accident due to intracerebral hemorrhage) ([place])   . Seizure disorder as sequela of cerebrovascular accident ([place]) 05/07/2015  . Subdural hematoma (H[facility])    Past Surgical History: History reviewed. No pertinent surgical history. HPI:  82 y.o.femalewith medical history significant of UTI, dementia, seizures. Admitted to teaching service in March with urosepsis. Family states that over the last few months, patient has not been getting around as well as before-- requiring a wheelchair instead of walker. She has also been unable to feed self for last month.  Admitted with UTI, sepsis, hypothermia.     Assessment / Plan / Recommendation Clinical Impression  Pt presents with a dementia-based dysphagia, likely with acute decline in function due to sepsis.  There is consistent oral holding of POs, coughing after consumption of thin liquids.  Pt unable to follow commands; verbalizing "yes maam" to all questions.  She is safest for a dysphagia 2 diet, nectar-thick liquids for today.  SLP will follow for safety, diet progression, and family education.     Aspiration Risk  Moderate aspiration risk    Diet Recommendation   dysphagia 2, nectar-thick liquids  Medication Administration: Whole meds with puree    Other  Recommendations Oral Care Recommendations: Oral care BID Other Recommendations: Order thickener from pharmacy   Follow up Recommendations        Frequency and Duration min 2x/week  1 week       Prognosis Prognosis for Safe Diet  Advancement: Good      Swallow Study   General Date of Onset: 12/25/15 HPI: 82 y.o.femalewith medical history significant of UTI, dementia, seizures. Admitted to teaching service in March with urosepsis. Family states that over the last few months, patient has not been getting around as well as before-- requiring a wheelchair instead of walker. She has also been unable to feed self for last month.  Admitted with UTI, sepsis, hypothermia.   Type of Study: Bedside Swallow Evaluation Previous Swallow Assessment: yes- normal findings Diet Prior to this Study: NPO Temperature Spikes Noted: No Respiratory Status: Room air History of Recent Intubation: No Behavior/Cognition: Alert;Confused Oral Cavity Assessment: Within Functional Limits Oral Care Completed by SLP: No Oral Cavity - Dentition: Missing dentition Vision: Functional for self-feeding Self-Feeding Abilities: Needs assist;Needs set up Patient Positioning: Upright in bed Baseline Vocal Quality: Normal Volitional Cough: Cognitively unable to elicit Volitional Swallow: Unable to elicit    Oral/Motor/Sensory Function Overall Oral Motor/Sensory Function: Within functional limits   Ice Chips Ice chips: Within functional limits   Thin Liquid Thin Liquid: Impaired Presentation: Cup Oral Phase Functional Implications: Oral holding Pharyngeal  Phase Impairments: Cough - Immediate    Nectar Thick Nectar Thick Liquid: Within functional limits   Honey Thick Honey Thick Liquid: Not tested   Puree Puree: Impaired Presentation: Spoon Oral Phase Functional Implications: Oral holding   Solid   GO  Ivon Roedel L. [place], [place] CCC/SLP Pager [phone]  Solid: Not tested        Juan Quam Laurice 12/26/2015,10:36 AM

## 2015-12-26 NOTE — Consult Note (Signed)
Consultation Note Date: 12/26/2015   Patient Name: Mariah Arias  DOB: 1924/07/18  MRN: [id number]  Age / Sex: 82 y.o., female  PCP: Noe Gens, MD Referring Physician: Allie Bossier, MD  Reason for Consultation: Establishing goals of care  HPI/Patient Profile: 82 y.o. female  with past medical history of UTI, dementia, and seizures admitted on 12/25/2015 with urosepsis. This is second admission in the last six months for UTI. In March 2017 she was admitted and dc'd to rehab before going home.   Clinical Assessment and Goals of Care: Patient resides at her son's, [name] [facility]) home and has a 24 hour caretaker. She has had some recent significant declines in her po intake, she has stopped walking, and is nonverbal. Discussed trajectory of dementia with son, [name] and daughter in Sports coach. They have attended classes and understand that patient is likely nearing end of life. Reviewed MOST form and discussed GOC. They do not desire artificial feeding. We discussed possibility of Hospice support at home after discharge. [name] notes that rehab will likely not increase quality of life for patient as she will be in an unfamiliar setting. Family desires to continue current care for another 24 hours and hopeful for improvement. They then desire to keep patient at home after this hospitalization. [name] and his wife are going to discuss options with David's siblings. For now they desire to treat patient with IV fluids and antibiotics. They anticipate that siblings will agree to home with Hospice care at discharge.   HCPOA - son- [name]    SUMMARY OF RECOMMENDATIONS -Continue current level of care, do not escalate -Complete treatment with IV antibiotics and fluids -No artifical feeding -Family considering home with Hospice care at d/c   Code Status/Advance Care Planning:  DNR    Symptom Management:    Delirium protocol  Palliative Prophylaxis:   Delirium Protocol  Additional Recommendations (Limitations, Scope, Preferences):  Avoid Hospitalization and No Artificial Feeding  Psycho-social/Spiritual:   Desire for further Chaplaincy support:No  Additional Recommendations: Education on Hospice  Prognosis:   < 6 months d/t advanced dementia, decreased po intake PPS: 10%  Discharge Planning: To Be Determined anticipate home with Hospice      Primary Diagnoses: Present on Admission: . Sepsis ([place]) . Adrenal insufficiency ([place])   I have reviewed the medical record, interviewed the patient and family, and examined the patient. The following aspects are pertinent.  Past Medical History:  Diagnosis Date  . Adrenal insufficiency ([place])   . Complicated UTI (urinary tract infection) 05/07/2015  . CVA (cerebrovascular accident due to intracerebral hemorrhage) ([place])   . Seizure disorder as sequela of cerebrovascular accident ([place]) 05/07/2015  . Subdural hematoma [facility])    Social History   Social History  . Marital status: Single    Spouse name: N/A  . Number of children: N/A  . Years of education: N/A   Social History Main Topics  . Smoking status: Unknown If Ever Smoked  . Smokeless tobacco: None  . Alcohol use None  .  Drug use: Unknown  . Sexual activity: Not Asked   Other Topics Concern  . None   Social History Narrative  . None   History reviewed. No pertinent family history. Scheduled Meds: . enoxaparin (LOVENOX) injection  40 mg Subcutaneous Q24H  . hydrocortisone sod succinate (SOLU-CORTEF) inj  50 mg Intravenous Q8H  . levETIRAcetam  1,000 mg Oral BID  . piperacillin-tazobactam (ZOSYN)  IV  3.375 g Intravenous Q8H  . sodium chloride flush  3 mL Intravenous Q12H  . vancomycin  1,000 mg Intravenous Q24H   Continuous Infusions: . sodium chloride 100 mL/hr at 12/26/15 0700   PRN Meds:.acetaminophen **OR** acetaminophen, docusate sodium Medications  Prior to Admission:  Prior to Admission medications   Medication Sig Start Date End Date Taking? Authorizing Provider  hydrocortisone (CORTEF) 20 MG tablet Take 1 tablet (20 mg total) by mouth daily. Take at 6 AM daily. 05/12/15  Yes Zada Finders, MD  levETIRAcetam (KEPPRA) 750 MG tablet Take 2 tablets (1,500 mg total) by mouth 2 (two) times daily. Patient taking differently: Take 1,000 mg by mouth 2 (two) times daily.  05/11/15  Yes Zada Finders, MD  Menthol, Topical Analgesic, (BENGAY EX) Apply 1 application topically as needed (for pain).   Yes Historical Provider, MD  neomycin-bacitracin-polymyxin (NEOSPORIN) ointment Apply 1 application topically as needed for wound care.   Yes Historical Provider, MD  nitrofurantoin, macrocrystal-monohydrate, (MACROBID) 100 MG capsule Take 100 mg by mouth daily.   Yes Historical Provider, MD  acetaminophen (TYLENOL) 650 MG CR tablet Take 650 mg by mouth every 8 (eight) hours as needed for pain.    Historical Provider, MD  docusate sodium (COLACE) 100 MG capsule Take 100 mg by mouth 2 (two) times daily as needed for mild constipation.    Historical Provider, MD  hydrocortisone (CORTEF) 10 MG tablet Take 1 tablet (10 mg total) by mouth daily. Take at 4 PM daily (or 4-6 hours before bedtime). Patient not taking: Reported on 12/25/2015 05/12/15   Zada Finders, MD   No Known Allergies Review of Systems  Unable to perform ROS: Dementia    Physical Exam  Constitutional:  Frail, elderly, cachectic  HENT:  Head: Normocephalic and atraumatic.  No teeth  Eyes: Conjunctivae and EOM are normal.  Cardiovascular: Normal rate and regular rhythm.   Pulmonary/Chest: Effort normal and breath sounds normal.  Abdominal: Soft. Bowel sounds are normal.  Musculoskeletal: She exhibits no edema or tenderness.  Neurological:  Drowsy, awakes to verbal stimuli  Skin: Skin is warm and dry.  Psychiatric:  Verbal responses limited to "yes"   Nursing note and vitals  reviewed.   Vital Signs: BP (!) 100/58   Pulse (!) 55   Temp 97.9 F (36.6 C) (Oral)   Resp 11   Ht 5\' 7"  (1.702 m)   Wt 65.8 kg (145 lb)   SpO2 100%   BMI 22.71 kg/m  Pain Assessment: PAINAD   Pain Score: 0-No pain   SpO2: SpO2: 100 % O2 Device:SpO2: 100 % O2 Flow Rate: .   IO: Intake/output summary:  Intake/Output Summary (Last 24 hours) at 12/26/15 1345 Last data filed at 12/26/15 1000  Gross per 24 hour  Intake             1150 ml  Output                0 ml  Net             1150 ml    LBM:  Baseline Weight: Weight: 65.8 kg (145 lb) Most recent weight: Weight: 65.8 kg (145 lb)     Palliative Assessment/Data: PPS: 10%     Time In: 1400 Time Out:1500 Time Total: 60 mins Greater than 50%  of this time was spent counseling and coordinating care related to the above assessment and plan.  Signed by: Mariana Kaufman, AGNP-C Palliative Medicine    Please contact Palliative Medicine Team phone at [phone] for questions and concerns.  For individual provider: See Shea Evans

## 2015-12-26 NOTE — Progress Notes (Signed)
PROGRESS NOTE    Mariah Arias  [id number] DOB: 30-Aug-1924 DOA: 12/25/2015 PCP: Noe Gens, MD   Brief Narrative:  82 y.o. BF PMHx  Complicated UTI ,Dementia,  CVA,Seizure disorder as sequela of cerebrovascular accident, Subdural hematoma ,  Admitted to teaching service in March with urosepsis. Family states that over the last few months, patient has not been getting around as well as before-- requiring a wheelchair instead of walker.  She has also been unable to feed self for last month.  Caregiver contacted son stating that patient has been more confused and not "acting like herself".   In the ER, she was found to he hypothermic, have a UTI and be septic from that. Nursing aid in room states cathed urine appeared to be purulent.   She was started on bair hugger, given dose of IV decadron-- for h/o adrenal insuff.  Patient's family stated that she is a DNR and they were open to palliative care discussions if patient were not to improve.    Subjective: 11/7 alert pleasantly demented cooperative with exam.   Assessment & Plan:   Active Problems:   Adrenal insufficiency (HCC)   Seizure disorder (HCC)   Sepsis ([place])   Adrenal insufficiency (HCC)   Seizure disorder (HCC)   Sepsis ([place])  Sepsis due to UTI -IV vanc/zosyn given in ER -lactic acid resolved -Normal saline 100 ml/hr -Bear Hugger; Maintain Temp>  37C  Adrenal insufficiency (hypothermic and hypotensive) -IV stress dose steroids: Solu-Cortef 50 mg TID -cortisol level (not drawn) now on stress dose steroids -hold PO  Seizure d/o - Keppra 1000 mg BID  Goals of care -Palliative care consult placed   DVT prophylaxis: Lovenox Code Status: DO NOT RESUSCITATE Family Communication: None Disposition Plan: Per palliative care recommendation   Consultants:  Palliative care    Procedures/Significant Events:    Cultures 11/6 urine positive yeast 11/6 MRSA by PCR negative 11/6  blood pending   Antimicrobials: Zosyn 11/6>>  Vancomycin 11/6>>  Devices None   LINES / TUBES:  None    Continuous Infusions: . sodium chloride 100 mL/hr at 12/26/15 0700     Objective: Vitals:   12/25/15 2358 12/26/15 0045 12/26/15 0355 12/26/15 0810  BP:  116/62 (!) 90/55 111/64  Pulse:  67 (!) 58 (!) 52  Resp:  18 (!) 9 18  Temp: 98.3 F (36.8 C) 97.8 F (36.6 C) 97.5 F (36.4 C) 97.3 F (36.3 C)  TempSrc: Oral Axillary Axillary Oral  SpO2:  98% 98% 98%  Weight:      Height:        Intake/Output Summary (Last 24 hours) at 12/26/15 1053 Last data filed at 12/26/15 1000  Gross per 24 hour  Intake             1100 ml  Output                0 ml  Net             1100 ml   Filed Weights   12/25/15 1212  Weight: 65.8 kg (145 lb)    Examination:  General: alert pleasantly demented cooperative with exam., No acute respiratory distress Eyes: negative scleral hemorrhage, negative anisocoria, negative icterus ENT: Negative Runny nose, negative gingival bleeding, Neck:  Negative scars, masses, torticollis, lymphadenopathy, JVD Lungs: Clear to auscultation bilaterally without wheezes or crackles Cardiovascular: Regular rate and rhythm without murmur gallop or rub normal S1 and S2 Abdomen: negative abdominal pain, nondistended, positive soft, bowel  sounds, no rebound, no ascites, no appreciable mass Extremities: No significant cyanosis, clubbing, or edema bilateral lower extremities Skin: Negative rashes, lesions, ulcers Psychiatric:  Unable to assess secondary to dementia Central nervous system:  Moves all extremities to command .     Data Reviewed: Care during the described time interval was provided by me .  I have reviewed this patient's available data, including medical history, events of note, physical examination, and all test results as part of my evaluation. I have personally reviewed and interpreted all radiology studies.  CBC:  Recent Labs Lab  01/12/2016 1218 12/26/15 0231  WBC 5.1 7.7  NEUTROABS 3.0  --   HGB 14.9 12.4  HCT 43.3 35.9*  MCV 87.5 85.5  PLT 215 0000000   Basic Metabolic Panel:  Recent Labs Lab 01/12/2016 1218 Jan 12, 2016 1525 12/26/15 0231  NA 145 143 144  K 4.7 4.1 4.4  CL 104 109 108  CO2 31 27 25   GLUCOSE 165* 134* 119*  BUN 15 15 17   CREATININE 1.34* 1.16* 1.26*  CALCIUM 9.9 8.7* 8.9   GFR: Estimated Creatinine Clearance: 29.4 mL/min (by C-G formula based on SCr of 1.26 mg/dL (H)). Liver Function Tests:  Recent Labs Lab 01/12/2016 1218 01/12/2016 1525  AST 39 33  ALT 25 24  ALKPHOS 167* 134*  BILITOT 0.7 0.7  PROT 6.7 5.4*  ALBUMIN 3.2* 2.7*   No results for input(s): LIPASE, AMYLASE in the last 168 hours. No results for input(s): AMMONIA in the last 168 hours. Coagulation Profile: No results for input(s): INR, PROTIME in the last 168 hours. Cardiac Enzymes: No results for input(s): CKTOTAL, CKMB, CKMBINDEX, TROPONINI in the last 168 hours. BNP (last 3 results) No results for input(s): PROBNP in the last 8760 hours. HbA1C: No results for input(s): HGBA1C in the last 72 hours. CBG: No results for input(s): GLUCAP in the last 168 hours. Lipid Profile: No results for input(s): CHOL, HDL, LDLCALC, TRIG, CHOLHDL, LDLDIRECT in the last 72 hours. Thyroid Function Tests:  Recent Labs  01-12-2016 1759  TSH 1.177   Anemia Panel: No results for input(s): VITAMINB12, FOLATE, FERRITIN, TIBC, IRON, RETICCTPCT in the last 72 hours. Urine analysis:    Component Value Date/Time   COLORURINE YELLOW 2016/01/12 1315   APPEARANCEUR TURBID (A) 01-12-16 1315   LABSPEC 1.013 2016/01/12 1315   PHURINE 6.0 12-Jan-2016 1315   GLUCOSEU NEGATIVE Jan 12, 2016 1315   HGBUR MODERATE (A) 01/12/16 1315   BILIRUBINUR NEGATIVE January 12, 2016 1315   KETONESUR NEGATIVE 12-Jan-2016 1315   PROTEINUR 30 (A) 01-12-2016 1315   NITRITE NEGATIVE 12-Jan-2016 1315   LEUKOCYTESUR LARGE (A) January 12, 2016 1315   Sepsis  Labs: @LABRCNTIP (procalcitonin:4,lacticidven:4)  ) Recent Results (from the past 240 hour(s))  MRSA PCR Screening     Status: None   Collection Time: January 12, 2016 10:33 PM  Result Value Ref Range Status   MRSA by PCR NEGATIVE NEGATIVE Final    Comment:        The GeneXpert MRSA Assay (FDA approved for NASAL specimens only), is one component of a comprehensive MRSA colonization surveillance program. It is not intended to diagnose MRSA infection nor to guide or monitor treatment for MRSA infections.          Radiology Studies: Dg Chest Port 1 View  Result Date: 2016-01-12 CLINICAL DATA:  Sepsis, possible urinary tract infection. History of previous CVA. EXAM: PORTABLE CHEST 1 VIEW COMPARISON:  Portable chest x-ray of May 06, 2015 FINDINGS: The lungs are adequately inflated and clear. The heart  and pulmonary vascularity are normal. The mediastinum is normal in width. The bony thorax exhibits no acute abnormality. IMPRESSION: There is no active cardiopulmonary disease. Electronically Signed   By: David  Martinique M.D.   On: 12/25/2015 13:29        Scheduled Meds: . enoxaparin (LOVENOX) injection  40 mg Subcutaneous Q24H  . hydrocortisone sod succinate (SOLU-CORTEF) inj  50 mg Intravenous Q8H  . levETIRAcetam  1,000 mg Oral BID  . piperacillin-tazobactam (ZOSYN)  IV  3.375 g Intravenous Q8H  . RESOURCE THICKENUP CLEAR   Oral Once  . sodium chloride flush  3 mL Intravenous Q12H  . vancomycin  1,000 mg Intravenous Q24H   Continuous Infusions: . sodium chloride 100 mL/hr at 12/26/15 0700     LOS: 1 day    Time spent: 40 minutes    Curtisha Bendix, Geraldo Docker, MD [facility] Pager [phone]   If 7PM-7AM, please contact night-coverage [email or web address] Password [email or web address] 12/26/2015, 10:53 AM

## 2015-12-27 DIAGNOSIS — N390 Urinary tract infection, site not specified: Secondary | ICD-10-CM

## 2015-12-27 DIAGNOSIS — F0390 Unspecified dementia without behavioral disturbance: Secondary | ICD-10-CM | POA: Diagnosis present

## 2015-12-27 DIAGNOSIS — Z7189 Other specified counseling: Secondary | ICD-10-CM

## 2015-12-27 DIAGNOSIS — Z515 Encounter for palliative care: Secondary | ICD-10-CM

## 2015-12-27 DIAGNOSIS — A419 Sepsis, unspecified organism: Principal | ICD-10-CM

## 2015-12-27 DIAGNOSIS — E2740 Unspecified adrenocortical insufficiency: Secondary | ICD-10-CM

## 2015-12-27 MED ORDER — ENOXAPARIN SODIUM 30 MG/0.3ML ~~LOC~~ SOLN: 30.0000 mg | SUBCUTANEOUS | Status: DC

## 2015-12-27 MED ORDER — ENOXAPARIN SODIUM 30 MG/0.3ML ~~LOC~~ SOLN
30.0000 mg | SUBCUTANEOUS | Status: DC
  Administered 2015-12-27: 30 mg via SUBCUTANEOUS
  Filled 2015-12-27: qty 0.3

## 2015-12-27 MED ORDER — HYDROCORTISONE NA SUCCINATE PF 100 MG IJ SOLR: 50.0000 mg | Freq: Two times a day (BID) | INTRAMUSCULAR | Status: DC

## 2015-12-27 MED ORDER — HYDROCORTISONE 20 MG PO TABS
20.0000 mg | ORAL_TABLET | Freq: Every day | ORAL | Status: DC
  Administered 2015-12-28 – 2015-12-29 (×2): 20 mg via ORAL
  Filled 2015-12-27 (×2): qty 1

## 2015-12-27 MED ORDER — ENOXAPARIN SODIUM 30 MG/0.3ML ~~LOC~~ SOLN
30.0000 mg | SUBCUTANEOUS | Status: DC
  Administered 2015-12-28 – 2015-12-30 (×3): 30 mg via SUBCUTANEOUS
  Filled 2015-12-27 (×3): qty 0.3

## 2015-12-27 MED ORDER — LEVETIRACETAM 100 MG/ML PO SOLN
1000.0000 mg | Freq: Two times a day (BID) | ORAL | Status: DC
  Administered 2015-12-27 – 2015-12-30 (×7): 1000 mg via ORAL
  Filled 2015-12-27 (×7): qty 10

## 2015-12-27 NOTE — Progress Notes (Signed)
Daily Progress Note   Patient Name: Mariah Arias       Date: 12/27/2015 DOB: March 26, 1924  Age: 82 y.o. MRN#: [id number] Attending Physician: Cherene Altes, MD Primary Care Physician: Noe Gens, MD Admit Date: 12/25/2015  Reason for Consultation/Follow-up: Establishing goals of care  Subjective: Met with son [name]. He has noted improvement in his mother's status. She is more alert and smiling. She is eating and drinking when offered. He spoke with family and they all agree to proceed home with her personal caregiver and Hospice support at discharge after completion of IV antibiotics and IV rehydration. MOST form completed and signed. David elects comfort measures, DNR, no artificial feeding, consider IV fluids, consider IV antibiotics if indicated. He was very thankful for palliative medicine support.    Review of Systems  Unable to perform ROS: Dementia    Length of Stay: 2  Current Medications: Scheduled Meds:  . enoxaparin (LOVENOX) injection  30 mg Subcutaneous Q24H  . hydrocortisone sod succinate (SOLU-CORTEF) inj  50 mg Intravenous Q8H  . levETIRAcetam  1,000 mg Oral BID  . piperacillin-tazobactam (ZOSYN)  IV  3.375 g Intravenous Q8H  . sodium chloride flush  3 mL Intravenous Q12H  . vancomycin  1,000 mg Intravenous Q24H    Continuous Infusions: . sodium chloride 100 mL/hr at 12/26/15 2302    PRN Meds: acetaminophen **OR** acetaminophen, docusate sodium  Physical Exam  Constitutional: No distress.  Elderly, catchectic, appears comfortable in bed eating  Cardiovascular: Regular rhythm.   bradycardic  Pulmonary/Chest: Effort normal and breath sounds normal.  Abdominal: Soft. Bowel sounds are normal.  Musculoskeletal:  Generalized weakness    Neurological: She is alert.  Pleasantly confused  Psychiatric:  No verbal responses on exam            Vital Signs: BP 111/65   Pulse (!) 49   Temp 98 F (36.7 C) (Axillary)   Resp 13   Ht 5' 7"  (1.702 m)   Wt 65.8 kg (145 lb)   SpO2 99%   BMI 22.71 kg/m  SpO2: SpO2: 99 % O2 Device: O2 Device: Not Delivered O2 Flow Rate:    Intake/output summary:  Intake/Output Summary (Last 24 hours) at 12/27/15 1557 Last data filed at 12/27/15 1005  Gross per 24 hour  Intake  2210 ml  Output                0 ml  Net             2210 ml   LBM: Last BM Date:  (unknown) Baseline Weight: Weight: 65.8 kg (145 lb) Most recent weight: Weight: 65.8 kg (145 lb)       Palliative Assessment/Data: PPS: 20%     Patient Active Problem List   Diagnosis Date Noted  . Dementia without behavioral disturbance   . Palliative care by specialist   . Advance care planning   . Sepsis secondary to UTI ([place])   . Hypothermia   . Sepsis ([place]) 12/25/2015  . Constipation 05/23/2015  . Recurrent urinary tract infection 05/15/2015  . Seizure disorder ([place]) 05/15/2015  . Adrenal insufficiency ([place]) 05/10/2015  . Anemia due to bone marrow failure ([place])   . Hypoglycemia, unspecified   . Body temperature low   . UTI (urinary tract infection) 05/07/2015  . Seizure disorder as sequela of cerebrovascular accident ([place]) 05/07/2015  . Acute encephalopathy   . SAH (subarachnoid hemorrhage) ([place]) 05/06/2015  . Subarachnoid bleed ([place]) 05/06/2015    Palliative Care Assessment & Plan   Patient Profile: 82 y.o. female  with past medical history of UTI, dementia, and seizures admitted on 12/25/2015 with urosepsis. This is second admission in the last six months for UTI. In March 2017 she was admitted and dc'd to rehab before going home.   Assessment/Recommendations/Plan   End stage dementia FAST 7C- stabilize and return home with personal caregiver and Hospice support  Goals of Care and  Additional Recommendations:  Limitations on Scope of Treatment: Avoid Hospitalization, Minimize Medications, Initiate Comfort Feeding, No Artificial Feeding, No Surgical Procedures and No Tracheostomy  Code Status:  DNR  Prognosis:   < 6 months d/t advanced dementia FAST 7C, decreased po intake, family choosing comfort measures, nonambulatory, PPS: 20%   Discharge Planning:  Home with Hospice  Care plan was discussed with patient's son, [name].   Thank you for allowing the Palliative Medicine Team to assist in the care of this patient.   Time In: 1530 Time Out: 1600 Total Time 30 mins Prolonged Time Billed No      Greater than 50%  of this time was spent counseling and coordinating care related to the above assessment and plan.  Mariana Kaufman, AGNP-C Palliative Medicine   Please contact Palliative Medicine Team phone at [phone] for questions and concerns.

## 2015-12-27 NOTE — Progress Notes (Signed)
Speech Language Pathology Treatment: Dysphagia  Patient Details Name: Tanija Germani MRN: [id number] DOB: 1924/08/01 Today's Date: 12/27/2015 Time: 6770-3403 SLP Time Calculation (min) (ACUTE ONLY): 26 min  Assessment / Plan / Recommendation Clinical Impression  F/u for swallowing; assisted with hand-feeding lunch.  Pt interactive, talking.  Readily consumed dysphagia 2 solids, thin and nectar-thick liquids with excellent toleration, no overt s/s of aspiration, mild oral holding and delays, but swallowing is quite functional.   Recommend continuing dysphagia 2 diet, allow thin liquids; continue meds whole in puree.  No further SLP f/u warranted - our services will sign of.    HPI HPI: 82 y.o.femalewith medical history significant of UTI, dementia, seizures. Admitted to teaching service in March with urosepsis. Family states that over the last few months, patient has not been getting around as well as before-- requiring a wheelchair instead of walker. She has also been unable to feed self for last month.  Admitted with UTI, sepsis, hypothermia.        SLP Plan  All goals met     Recommendations  Diet recommendations: Dysphagia 2 (fine chop);Thin liquid Liquids provided via: Cup;Straw Medication Administration: Whole meds with puree Supervision: Staff to assist with self feeding Compensations: Minimize environmental distractions;Slow rate;Small sips/bites                Oral Care Recommendations: Oral care BID Follow up Recommendations: None Plan: All goals met       GO              Sayana Salley L. Tivis Ringer, [place] CCC/SLP Pager [phone]   Juan Quam Laurice 12/27/2015, 2:55 PM

## 2015-12-27 NOTE — Progress Notes (Signed)
[place] TEAM 1 - Stepdown/ICU TEAM  Mariah Arias  [id number] DOB: Jul 28, 1924 DOA: 12/25/2015 PCP: Noe Gens, MD    Brief Narrative:  82 y.o.F Hx Complicated UTI, Dementia, CVA, Seizure disorder as sequela of cerebrovascular accident, and subdural hematoma who has been on a declining tradjectory for several months per her family.  Her Caregiver contacted her son stating that patient has been more confused and not "acting like herself".   In the ER she was found to he hypothermic with a UTI.  Subjective: The patient is resting comfortably in bed at the time of visit.  There is no family present.  She does not awaken to my exam.  Assessment & Plan:  Sepsis due to UTI No organism identified on cultures thus far - cont empiric abx for now - stop vanc as MRSA screen negative   Adrenal insufficiency  Wean stress dose steroids to off asap   Acute renal injury  Creatinine w/o signif change   Recent Labs Lab 12/25/15 1218 12/25/15 1525 12/26/15 0231  CREATININE 1.34* 1.16* 1.26*    End stage dementia  After completion of active tx this admit, plan is to transition to comfort care & d/c home w/ hospice  Seizure d/o Cont home med tx as able   DVT prophylaxis: lovenox  Code Status: DNR - NO CODE  Family Communication: no family present at time of exam  Disposition Plan: complete course of abx tx, then d/c home for hospice/comfort focused care  Consultants:  Palliative Care   Procedures: none  Antimicrobials:  Zosyn 11/6 > Vancomycin 11/6 > 11/8  Objective: Blood pressure 111/65, pulse (!) 49, temperature 98 F (36.7 C), temperature source Axillary, resp. rate 13, height 5\' 7"  (1.702 m), weight 65.8 kg (145 lb), SpO2 99 %.  Intake/Output Summary (Last 24 hours) at 12/27/15 1629 Last data filed at 12/27/15 1005  Gross per 24 hour  Intake          2021.67 ml  Output                0 ml  Net          2021.67 ml   Filed Weights   12/25/15 1212  Weight: 65.8 kg (145 lb)    Examination: General: No acute respiratory distress evident  Lungs: respirations comfortable  Cardiovascular: Regular rate Abdomen: Nondistended Extremities: No significant cyanosis  CBC:  Recent Labs Lab 12/25/15 1218 12/26/15 0231  WBC 5.1 7.7  NEUTROABS 3.0  --   HGB 14.9 12.4  HCT 43.3 35.9*  MCV 87.5 85.5  PLT 215 0000000   Basic Metabolic Panel:  Recent Labs Lab 12/25/15 1218 12/25/15 1525 12/26/15 0231  NA 145 143 144  K 4.7 4.1 4.4  CL 104 109 108  CO2 31 27 25   GLUCOSE 165* 134* 119*  BUN 15 15 17   CREATININE 1.34* 1.16* 1.26*  CALCIUM 9.9 8.7* 8.9   GFR: Estimated Creatinine Clearance: 29.4 mL/min (by C-G formula based on SCr of 1.26 mg/dL (H)).  Liver Function Tests:  Recent Labs Lab 12/25/15 1218 12/25/15 1525  AST 39 33  ALT 25 24  ALKPHOS 167* 134*  BILITOT 0.7 0.7  PROT 6.7 5.4*  ALBUMIN 3.2* 2.7*    Recent Results (from the past 240 hour(s))  Blood Culture (routine x 2)     Status: None (Preliminary result)   Collection Time: 12/25/15 12:20 PM  Result Value Ref Range Status   Specimen Description BLOOD RIGHT ANTECUBITAL  Final   Special Requests   Final    BOTTLES DRAWN AEROBIC AND ANAEROBIC 10CC AER 5CC ANA   Culture NO GROWTH 2 DAYS  Final   Report Status PENDING  Incomplete  Urine culture     Status: Abnormal   Collection Time: 12/25/15  1:15 PM  Result Value Ref Range Status   Specimen Description URINE, CATHETERIZED  Final   Special Requests NONE  Final   Culture 20,000 COLONIES/mL YEAST (A)  Final   Report Status 12/26/2015 FINAL  Final  MRSA PCR Screening     Status: None   Collection Time: 12/25/15 10:33 PM  Result Value Ref Range Status   MRSA by PCR NEGATIVE NEGATIVE Final    Comment:        The GeneXpert MRSA Assay (FDA approved for NASAL specimens only), is one component of a comprehensive MRSA colonization surveillance program. It is not intended to diagnose  MRSA infection nor to guide or monitor treatment for MRSA infections.   Blood Culture (routine x 2)     Status: None (Preliminary result)   Collection Time: 12/25/15 10:50 PM  Result Value Ref Range Status   Specimen Description BLOOD LEFT HAND  Final   Special Requests BOTTLES DRAWN AEROBIC ONLY 5CC  Final   Culture NO GROWTH 1 DAY  Final   Report Status PENDING  Incomplete     Scheduled Meds: . enoxaparin (LOVENOX) injection  30 mg Subcutaneous Q24H  . hydrocortisone sod succinate (SOLU-CORTEF) inj  50 mg Intravenous Q8H  . levETIRAcetam  1,000 mg Oral BID  . piperacillin-tazobactam (ZOSYN)  IV  3.375 g Intravenous Q8H  . sodium chloride flush  3 mL Intravenous Q12H  . vancomycin  1,000 mg Intravenous Q24H     LOS: 2 days   Cherene Altes, MD [facility] Office  [phone] Pager - Text Page per Shea Evans as per below:  On-Call/Text Page:      [email or web address]      password [email or web address]  If 7PM-7AM, please contact night-coverage [email or web address] Password [facility] 12/27/2015, 4:29 PM

## 2015-12-28 NOTE — Progress Notes (Signed)
PROGRESS NOTE    Mariah Arias  [id number] DOB: 1924/03/03 DOA: 12/25/2015 PCP: Noe Gens, MD   Brief Narrative:  82 y.o. BF PMHx  Complicated UTI ,Dementia,  CVA,Seizure disorder as sequela of cerebrovascular accident, Subdural hematoma ,  Admitted to teaching service in March with urosepsis. Family states that over the last few months, patient has not been getting around as well as before-- requiring a wheelchair instead of walker.  She has also been unable to feed self for last month.  Caregiver contacted son stating that patient has been more confused and not "acting like herself".   In the ER, she was found to he hypothermic, have a UTI and be septic from that. Nursing aid in room states cathed urine appeared to be purulent.   She was started on bair hugger, given dose of IV decadron-- for h/o adrenal insuff.  Patient's family stated that she is a DNR and they were open to palliative care discussions if patient were not to improve.    Subjective: 11/9 alert pleasantly demented cooperative with exam.   Assessment & Plan:   Active Problems:   Adrenal insufficiency (HCC)   Seizure disorder (HCC)   Sepsis ([place])   Sepsis secondary to UTI ([place])   Hypothermia   Dementia without behavioral disturbance   Palliative care by specialist   Advance care planning   Goals of care, counseling/discussion   Adrenal insufficiency ([place])   Seizure disorder ([place])   Sepsis ([place])  Sepsis due to UTI -IV vanc/zosyn given in ER -lactic acid resolved -Normal saline 100 ml/hr -Bear Hugger; Maintain Temp>  37C  Adrenal insufficiency (hypothermic and hypotensive) -IV stress dose steroids: Solu-Cortef 50 mg TID -cortisol level (not drawn) now on stress dose steroids -hold PO  Bradycardia Asymptomatic -Patient had episodes of asymptomatic bradycardia. Treatment would be atropine or pacing both of which would not be feasible with the patient who is DO NOT RESUSCITATE  and the plan is for home hospice.   Seizure d/o - Keppra 1000 mg BID  Goals of care -Palliative care consult placed   DVT prophylaxis: Lovenox Code Status: DO NOT RESUSCITATE Family Communication: None Disposition Plan: Per palliative care recommendation   Consultants:  Palliative care    Procedures/Significant Events:    Cultures 11/6 urine positive yeast 11/6 MRSA by PCR negative 11/6 blood pending   Antimicrobials: Zosyn 11/6>>  Vancomycin 11/6>> 11/8  Devices None   LINES / TUBES:  None    Continuous Infusions: . sodium chloride 75 mL/hr at 12/27/15 1736     Objective: Vitals:   12/27/15 2343 12/28/15 0200 12/28/15 0400 12/28/15 0813  BP: 126/67 (!) 130/55 (!) 138/54 (!) 109/49  Pulse: (!) 40 (!) 53 (!) 35   Resp: 16 12 12    Temp: 97.4 F (36.3 C)  97 F (36.1 C) 97.6 F (36.4 C)  TempSrc: Axillary  Axillary Axillary  SpO2: 100% 92% 100%   Weight:      Height:        Intake/Output Summary (Last 24 hours) at 12/28/15 0941 Last data filed at 12/28/15 0818  Gross per 24 hour  Intake             2060 ml  Output                0 ml  Net             2060 ml   Filed Weights   12/25/15 1212  Weight: 65.8 kg (145  lb)    Examination:  General: alert pleasantly demented cooperative with exam., No acute respiratory distress Eyes: negative scleral hemorrhage, negative anisocoria, negative icterus ENT: Negative Runny nose, negative gingival bleeding, Neck:  Negative scars, masses, torticollis, lymphadenopathy, JVD Lungs: Clear to auscultation bilaterally without wheezes or crackles Cardiovascular: Regular rate and rhythm without murmur gallop or rub normal S1 and S2 Abdomen: negative abdominal pain, nondistended, positive soft, bowel sounds, no rebound, no ascites, no appreciable mass Extremities: No significant cyanosis, clubbing, or edema bilateral lower extremities Skin: Negative rashes, lesions, ulcers Psychiatric:  Unable to assess  secondary to dementia Central nervous system:  Moves all extremities to command .     Data Reviewed: Care during the described time interval was provided by me .  I have reviewed this patient's available data, including medical history, events of note, physical examination, and all test results as part of my evaluation. I have personally reviewed and interpreted all radiology studies.  CBC:  Recent Labs Lab 12/25/15 1218 12/26/15 0231  WBC 5.1 7.7  NEUTROABS 3.0  --   HGB 14.9 12.4  HCT 43.3 35.9*  MCV 87.5 85.5  PLT 215 0000000   Basic Metabolic Panel:  Recent Labs Lab 12/25/15 1218 12/25/15 1525 12/26/15 0231  NA 145 143 144  K 4.7 4.1 4.4  CL 104 109 108  CO2 31 27 25   GLUCOSE 165* 134* 119*  BUN 15 15 17   CREATININE 1.34* 1.16* 1.26*  CALCIUM 9.9 8.7* 8.9   GFR: Estimated Creatinine Clearance: 29.4 mL/min (by C-G formula based on SCr of 1.26 mg/dL (H)). Liver Function Tests:  Recent Labs Lab 12/25/15 1218 12/25/15 1525  AST 39 33  ALT 25 24  ALKPHOS 167* 134*  BILITOT 0.7 0.7  PROT 6.7 5.4*  ALBUMIN 3.2* 2.7*   No results for input(s): LIPASE, AMYLASE in the last 168 hours. No results for input(s): AMMONIA in the last 168 hours. Coagulation Profile: No results for input(s): INR, PROTIME in the last 168 hours. Cardiac Enzymes: No results for input(s): CKTOTAL, CKMB, CKMBINDEX, TROPONINI in the last 168 hours. BNP (last 3 results) No results for input(s): PROBNP in the last 8760 hours. HbA1C: No results for input(s): HGBA1C in the last 72 hours. CBG: No results for input(s): GLUCAP in the last 168 hours. Lipid Profile: No results for input(s): CHOL, HDL, LDLCALC, TRIG, CHOLHDL, LDLDIRECT in the last 72 hours. Thyroid Function Tests:  Recent Labs  12/25/15 1759  TSH 1.177   Anemia Panel: No results for input(s): VITAMINB12, FOLATE, FERRITIN, TIBC, IRON, RETICCTPCT in the last 72 hours. Urine analysis:    Component Value Date/Time   COLORURINE  YELLOW 12/25/2015 1315   APPEARANCEUR TURBID (A) 12/25/2015 1315   LABSPEC 1.013 12/25/2015 1315   PHURINE 6.0 12/25/2015 1315   GLUCOSEU NEGATIVE 12/25/2015 1315   HGBUR MODERATE (A) 12/25/2015 1315   BILIRUBINUR NEGATIVE 12/25/2015 1315   KETONESUR NEGATIVE 12/25/2015 1315   PROTEINUR 30 (A) 12/25/2015 1315   NITRITE NEGATIVE 12/25/2015 1315   LEUKOCYTESUR LARGE (A) 12/25/2015 1315   Sepsis Labs: @LABRCNTIP (procalcitonin:4,lacticidven:4)  ) Recent Results (from the past 240 hour(s))  Blood Culture (routine x 2)     Status: None (Preliminary result)   Collection Time: 12/25/15 12:20 PM  Result Value Ref Range Status   Specimen Description BLOOD RIGHT ANTECUBITAL  Final   Special Requests   Final    BOTTLES DRAWN AEROBIC AND ANAEROBIC 10CC AER 5CC ANA   Culture NO GROWTH 2 DAYS  Final  Report Status PENDING  Incomplete  Urine culture     Status: Abnormal   Collection Time: 12/25/15  1:15 PM  Result Value Ref Range Status   Specimen Description URINE, CATHETERIZED  Final   Special Requests NONE  Final   Culture 20,000 COLONIES/mL YEAST (A)  Final   Report Status 12/26/2015 FINAL  Final  MRSA PCR Screening     Status: None   Collection Time: 12/25/15 10:33 PM  Result Value Ref Range Status   MRSA by PCR NEGATIVE NEGATIVE Final    Comment:        The GeneXpert MRSA Assay (FDA approved for NASAL specimens only), is one component of a comprehensive MRSA colonization surveillance program. It is not intended to diagnose MRSA infection nor to guide or monitor treatment for MRSA infections.   Blood Culture (routine x 2)     Status: None (Preliminary result)   Collection Time: 12/25/15 10:50 PM  Result Value Ref Range Status   Specimen Description BLOOD LEFT HAND  Final   Special Requests BOTTLES DRAWN AEROBIC ONLY 5CC  Final   Culture NO GROWTH 1 DAY  Final   Report Status PENDING  Incomplete         Radiology Studies: No results found.      Scheduled  Meds: . enoxaparin (LOVENOX) injection  30 mg Subcutaneous Q24H  . hydrocortisone  20 mg Oral Q0600  . levETIRAcetam  1,000 mg Oral BID  . piperacillin-tazobactam (ZOSYN)  IV  3.375 g Intravenous Q8H   Continuous Infusions: . sodium chloride 75 mL/hr at 12/27/15 1736     LOS: 3 days    Time spent: 40 minutes    Eleazar Kimmey, Geraldo Docker, MD [facility] Pager [phone]   If 7PM-7AM, please contact night-coverage [email or web address] Password [email or web address] 12/28/2015, 9:41 AM

## 2015-12-28 NOTE — Care Management Note (Signed)
Case Management Note  Patient Details  Name: Sakinah Gustaveson MRN: [id number] Date of Birth: 1924-10-22  Subjective/Objective:  Pt lives with caregiver, Ila Mcgill [phone]), and will discharge home with hospice services.  Provided list of hospice agencies to son, [name] [phone]), and referral made to [facility] per choice.                       Expected Discharge Plan:  [place]  Discharge planning Services  CM Consult  Choice offered to:  Adult Children  HH Arranged:  RN, Social Work [employer] Agency:  [facility] [place]  Status of Service:  In process, will continue to follow  Girard Cooter, RN 12/28/2015, 2:35 PM

## 2015-12-28 NOTE — Progress Notes (Signed)
Pharmacy Antibiotic Note Mariah Arias is a 82 y.o. female admitted on 12/25/2015 with sepsis and concern UTI. Currently on day 4 of Zosyn for treatment.   Plan: 1. Zosyn 3.375g IV q8h (4 hour infusion).  2. Assess ability to deescalate or change antibiotics to PO for remainder of treatment   Height: 5\' 7"  (170.2 cm) Weight: 145 lb (65.8 kg) IBW/kg (Calculated) : 61.6  Temp (24hrs), Avg:97.7 F (36.5 C), Min:97 F (36.1 C), Max:98.3 F (36.8 C)   Recent Labs Lab 12/25/15 1218 12/25/15 1237 12/25/15 1525 12/25/15 1533 12/25/15 1804 12/26/15 0231  WBC 5.1  --   --   --   --  7.7  CREATININE 1.34*  --  1.16*  --   --  1.26*  LATICACIDVEN  --  3.54*  --  1.92* 1.18  --     Estimated Creatinine Clearance: 29.4 mL/min (by C-G formula based on SCr of 1.26 mg/dL (H)).    No Known Allergies  Antimicrobials this admission:   11/6 Zosyn >>  11/6 vancomycin >> 11/8  Dose adjustments this admission:  N/a  Microbiology results:  11/6 BCx: ngtd 11/6 UCx: 20K yeast  11/6 MRSA PCR: neg   Thank you for allowing pharmacy to be a part of this patient's care.  Vincenza Hews, PharmD, BCPS 12/28/2015, 10:21 AM Pager: [phone]

## 2015-12-28 NOTE — Progress Notes (Signed)
MD Sherral Hammers made aware of patients bradycardia between 30-50 at times. No new orders received at this time. Will continue to monitor patient closely.

## 2015-12-28 NOTE — Progress Notes (Signed)
[facility] Liaison RN note  Notified by [facility] of family request for Hosp[facility] and [place] services at home after discharge.  Chart and patient information currently under review to confirm hospice eligibility.  Spoke with son, [name], by telephone, to initiate education related to hospice philosophy, services and team approach to care.  Family verbalized understanding of the information providied.  Per discussion, plan is for discharge to home via [facility],with caregiver, Ila Mcgill.    Please send signed completed DNR form home with patient. Patient will need prescriptions for discharge comfort medications.  **PATIENT WILL BE GOING TO [address], [place]**  Son, [name], states if patient does not return home on 11/10 or 11/11, he would like for [facility] admission to take place 11/15 or later, as he will be out of town.    DME needs discussed, and this is under consideration by son, [name].    [facility] Referral Center aware of the above.    Completed discharge summary will need to be faxed to [phone] when final.  Please notify [facility] when patient is ready to leave unit at discharge--call [phone].  [facility] information has been given to son, [name].   Above information shared with [facility].  Please call with questions.  Thank you, Efrain Sella, RN, BSN [phone]

## 2015-12-29 MED ORDER — HYDROCORTISONE 10 MG PO TABS
10.0000 mg | ORAL_TABLET | Freq: Two times a day (BID) | ORAL | Status: DC
  Filled 2015-12-29: qty 1

## 2015-12-29 MED ORDER — HYDROCORTISONE 10 MG PO TABS
10.0000 mg | ORAL_TABLET | ORAL | Status: DC
  Administered 2015-12-29 – 2015-12-30 (×2): 10 mg via ORAL
  Filled 2015-12-29 (×3): qty 1

## 2015-12-29 NOTE — Progress Notes (Signed)
Mariah Arias [facility] Liaison note  Spoke with patient's son, Mariah Arias, by telephone, who asks this Probation officer to speak with at home caregiver, Mariah Arias.  This Probation officer spoke with Mariah Arias and agreed upon the following dme:  Respiratory package, over bed table and lightweight wheelchair.  This Probation officer has requested [facility] DME representative order this equipment for delivery from [facility] before patient discharges home.  Per Mariah Arias, patient already has a hospital bed, walker and bedside commode in the home that are all owned.    [facility] has an appointment to see patient at home at 2 p.m. On 11/11.  Mariah Sella, RN, BSN [phone]

## 2015-12-29 NOTE — Consult Note (Addendum)
[place] Nurse wound consult note Reason for Consult: Consult requested for buttocks and coccyx.   Wound type: Pt has been frequently moist and skin to these areas is red and macerated with loose peeling skin in multiple locations, revealing red full thickness loss in patchy areas across bilat buttocks and sacrum.  Affected area is 8X12X.1cm; appearance consistent with moisture associated skin damage.  Mod amt tan drainage from inner gluteal fold location, no odor. Pressure Ulcer POA: This is not a pressure injury, but MASD is high risk to evolve into one related to the location, pt's immobile status and multiple systemic factors which can impair healing, and constant moisture. Dressing procedure/placement/frequency: Foam dressing to protect from further injury and absorb drainage.  No family persent to discuss plan of care. Pt plans to have Hospice assistance after discharge, according to the EMR. Please re-consult if further assistance is needed.  Thank-you,  Julien Girt MSN, [place], [place], [place], [place]

## 2015-12-29 NOTE — Care Management Note (Signed)
Case Management Note  Patient Details  Name: Deadre Schimming MRN: [id number] Date of Birth: April 21, 1924  Subjective/Objective:   Per attending, tentative plan is discharge Saturday with [facility] [place] following.  Notified son, [name] [id number]), Efrain Sella with Hospice [phone]), and weekend CM re plan.  OOF DNR has been completed and is in chart.            Expected Discharge Date:  12/30/15                 Expected Discharge Plan:  [place]  Discharge planning Services  CM Consult  Post Acute Care Choice:  Hospice Choice offered to:  Adult Children  HH Arranged:  RN, Social Work [employer] Agency:  [facility] [place]  Status of Service:  In process, will continue to follow  Girard Cooter, RN 12/29/2015, 10:15 AM

## 2015-12-29 NOTE — Care Management Important Message (Signed)
Important Message  Patient Details  Name: Mariah Arias MRN: [id number] Date of Birth: [birth year]   Medicare Important Message Given:  Yes    Milam Allbaugh Abena 12/29/2015, 11:00 AM

## 2015-12-29 NOTE — Progress Notes (Signed)
[name]  [id number] DOB: 10/24/1924 DOA: 12/25/2015 PCP: Mariah Gens, MD    Brief Narrative:  82 y.o.F Hx Complicated UTI, Dementia, CVA, Seizure disorder as sequela of cerebrovascular accident, and subdural hematoma who has been on a declining tradjectory for several months per her family.  Her Caregiver contacted her son stating that patient has been more confused and not "acting like herself".   In the ER she was found to he hypothermic with a UTI.  Subjective: The patient is resting comfortably at the time of my visit.  She does not awaken to my exam.  Assessment & Plan:  Sepsis due to UTI No organism identified on cultures thus far - cont empiric abx to complete 5 days of tx - stopped vanc as MRSA screen negative   Adrenal insufficiency  Returned to home dose of hydrocortisone    Acute renal injury  Creatinine has been stable th/o her hospitalization   Recent Labs Lab 12/25/15 1218 12/25/15 1525 12/26/15 0231  CREATININE 1.34* 1.16* 1.26*    End stage dementia  After completion of active tx this admit, plan is to transition to comfort care & d/c home w/ hospice - anticipate d/c home in AM 11/11  Seizure d/o Cont home med tx as able   DVT prophylaxis: lovenox  Code Status: DNR - NO CODE  Family Communication: no family present at time of exam  Disposition Plan: d/c home for hospice/comfort focused care 11/11  Consultants:  Palliative Care   Procedures: none  Antimicrobials:  Zosyn 11/6 > 11/10 Vancomycin 11/6 > 11/8  Objective: Blood pressure (!) 115/53, pulse (!) 49, temperature 97.9 F (36.6 C), temperature source Axillary, resp. rate 12, height 5\' 7"  (1.702 m), weight 65.8 kg (145 lb), SpO2 100 %.  Intake/Output Summary (Last 24 hours) at 12/29/15 1710 Last data filed at 12/29/15 1603  Gross per 24 hour  Intake             2025 ml  Output                0 ml  Net              2025 ml   Filed Weights   12/25/15 1212  Weight: 65.8 kg (145 lb)    Examination: General: No acute respiratory distress  Lungs: respirations unlabored   Cardiovascular: Regular rate Abdomen: Nondistended Extremities: No cyanosis  CBC:  Recent Labs Lab 12/25/15 1218 12/26/15 0231  WBC 5.1 7.7  NEUTROABS 3.0  --   HGB 14.9 12.4  HCT 43.3 35.9*  MCV 87.5 85.5  PLT 215 0000000   Basic Metabolic Panel:  Recent Labs Lab 12/25/15 1218 12/25/15 1525 12/26/15 0231  NA 145 143 144  K 4.7 4.1 4.4  CL 104 109 108  CO2 31 27 25   GLUCOSE 165* 134* 119*  BUN 15 15 17   CREATININE 1.34* 1.16* 1.26*  CALCIUM 9.9 8.7* 8.9   GFR: Estimated Creatinine Clearance: 29.4 mL/min (by C-G formula based on SCr of 1.26 mg/dL (H)).  Liver Function Tests:  Recent Labs Lab 12/25/15 1218 12/25/15 1525  AST 39 33  ALT 25 24  ALKPHOS 167* 134*  BILITOT 0.7 0.7  PROT 6.7 5.4*  ALBUMIN 3.2* 2.7*    Recent Results (from the past 240 hour(s))  Blood Culture (routine x 2)     Status: None (Preliminary result)   Collection Time: 12/25/15 12:20 PM  Result Value Ref  Range Status   Specimen Description BLOOD RIGHT ANTECUBITAL  Final   Special Requests   Final    BOTTLES DRAWN AEROBIC AND ANAEROBIC 10CC AER 5CC ANA   Culture NO GROWTH 4 DAYS  Final   Report Status PENDING  Incomplete  Urine culture     Status: Abnormal   Collection Time: 12/25/15  1:15 PM  Result Value Ref Range Status   Specimen Description URINE, CATHETERIZED  Final   Special Requests NONE  Final   Culture 20,000 COLONIES/mL YEAST (A)  Final   Report Status 12/26/2015 FINAL  Final  MRSA PCR Screening     Status: None   Collection Time: 12/25/15 10:33 PM  Result Value Ref Range Status   MRSA by PCR NEGATIVE NEGATIVE Final    Comment:        The GeneXpert MRSA Assay (FDA approved for NASAL specimens only), is one component of a comprehensive MRSA colonization surveillance program. It is not intended to diagnose  MRSA infection nor to guide or monitor treatment for MRSA infections.   Blood Culture (routine x 2)     Status: None (Preliminary result)   Collection Time: 12/25/15 10:50 PM  Result Value Ref Range Status   Specimen Description BLOOD LEFT HAND  Final   Special Requests BOTTLES DRAWN AEROBIC ONLY 5CC  Final   Culture NO GROWTH 3 DAYS  Final   Report Status PENDING  Incomplete     Scheduled Meds: . enoxaparin (LOVENOX) injection  30 mg Subcutaneous Q24H  . hydrocortisone  20 mg Oral Q0600  . levETIRAcetam  1,000 mg Oral BID  . piperacillin-tazobactam (ZOSYN)  IV  3.375 g Intravenous Q8H     LOS: 4 days   Cherene Altes, MD [facility] Office  [phone] Pager - Text Page per Amion as per below:  On-Call/Text Page:      [email or web address]      password [email or web address]  If 7PM-7AM, please contact night-coverage [email or web address] Password [email or web address] 12/29/2015, 5:10 PM

## 2015-12-30 DIAGNOSIS — G40909 Epilepsy, unspecified, not intractable, without status epilepticus: Secondary | ICD-10-CM

## 2015-12-30 LAB — CULTURE, BLOOD (ROUTINE X 2): CULTURE: NO GROWTH

## 2015-12-30 MED ORDER — LEVETIRACETAM 100 MG/ML PO SOLN: 1000.0000 mg | Freq: Two times a day (BID) | ORAL | 0 refills | Status: AC

## 2015-12-30 MED ORDER — HYDROCORTISONE 10 MG PO TABS: 10.0000 mg | ORAL_TABLET | Freq: Two times a day (BID) | ORAL | 0 refills | Status: AC

## 2015-12-30 NOTE — Discharge Instructions (Signed)
Hospice °Hospice is a service that is designed to provide people who are terminally ill and their families with medical, spiritual, and psychological support. Its aim is to improve your quality of life by keeping you as alert and comfortable as possible. Hospice is performed by a team of health care professionals and volunteers who: °· Help keep you comfortable. Hospice can be provided in your home or in a homelike setting. The hospice staff works with your family and friends to help meet your needs. You will enjoy the support of loved ones by receiving much of your basic care from family and friends. °· Provide pain relief and manage your symptoms. The staff supply all necessary medicines and equipment. °· Provide companionship when you are alone. °· Allow you and your family to rest. They may do light housekeeping, prepare meals, and run errands. °· Provide counseling. They will make sure your emotional, spiritual, and social needs and those of your family are being met. °· Provide spiritual care. Spiritual care is individualized to meet your needs and your family's needs. It may involve helping you look at what death means to you, say goodbye, or perform a specific religious ceremony or ritual. °Hospice teams often include: °· A nurse. °· A doctor. °· Social workers. °· Religious leaders (such as a chaplain). °· Trained volunteers. °WHEN SHOULD HOSPICE CARE BEGIN? °Most people who use hospice are believed to have fewer than 6 months to live. Your family and health care providers can help you decide when hospice services should begin. If your condition improves, you may discontinue the program. °WHAT SHOULD I CONSIDER BEFORE SELECTING A PROGRAM? °Most hospice programs are run by nonprofit, independent organizations. Some are affiliated with hospitals, nursing homes, or home health care agencies. Hospice programs can take place in the home or at a hospice center, hospital, or skilled nursing facility. When choosing  a hospice program, ask the following questions: °· What services are available to me? °· What services are offered to my loved ones? °· How involved are my loved ones? °· How involved is my health care provider? °· Who makes up the hospice care team? How are they trained or screened? °· How will my pain and symptoms be managed? °· If my circumstances change, can the services be provided in a different setting, such as my home or in the hospital? °· Is the program reviewed and licensed by the state or certified in some other way? °WHERE CAN I LEARN MORE ABOUT HOSPICE? °You can learn about existing hospice programs in your area from your health care providers. You can also read more about hospice online. The websites of the following organizations contain helpful information: °· The National Hospice and Palliative Care Organization (NHPCO). °· The Hospice Association of America (HAA). °· The Hospice Education Institute. °· The American Cancer Society (ACS). °· Hospice Net. °  °This information is not intended to replace advice given to you by your health care provider. Make sure you discuss any questions you have with your health care provider. °  °Document Released: 05/24/2003 Document Revised: 02/09/2013 Document Reviewed: 12/15/2012 °Elsevier Interactive Patient Education ©2016 Elsevier Inc. ° °

## 2015-12-30 NOTE — Discharge Summary (Signed)
DISCHARGE SUMMARY  Mariah Arias  MR#: [id number]  DOB:December 21, 1924  Date of Admission: 12/25/2015 Date of Discharge: 12/30/2015  Attending Physician:Suellyn Meenan T  Patient's RC:[id number] [name], MD  Consults:  Palliative Care / Hospice   Disposition: D/C home for hospice care   Discharge Diagnoses: Sepsis due to UTI Adrenal insufficiency  Acute renal injury  End stage dementia  Seizure d/o  Initial presentation: 82 y.o.F Hx Complicated UTI, Dementia, CVA, Seizure disorder as sequela of cerebrovascular accident,and subdural hematoma who has been on a declining tradjectory for several months per her family.  Her Caregiver contacted her son stating that patient has been more confused and not "acting like herself".   In the ER she was found to he hypothermic with a UTI.  Hospital Course:  Sepsis due to UTI No organism identified on cultures - completed a course of empiric abx during hospital stay   Adrenal insufficiency  Weaned stress dose steroids back to home does prior to d/c   Acute renal injury  Creatinine w/o signif change during hospital stay   End stage dementia  After completion of active tx the decision was made to transition to comfort care & d/c home w/ hospice  Seizure d/o Cont home med tx as able     Medication List    STOP taking these medications   levETIRAcetam 750 MG tablet Commonly known as:  KEPPRA Replaced by:  levETIRAcetam 100 MG/ML solution   neomycin-bacitracin-polymyxin ointment Commonly known as:  NEOSPORIN   nitrofurantoin (macrocrystal-monohydrate) 100 MG capsule Commonly known as:  MACROBID     TAKE these medications   acetaminophen 650 MG CR tablet Commonly known as:  TYLENOL Take 650 mg by mouth every 8 (eight) hours as needed for pain.   BENGAY EX Apply 1 application topically as needed (for pain).   docusate sodium 100 MG capsule Commonly known as:  COLACE Take 100 mg by mouth 2 (two) times  daily as needed for mild constipation.   hydrocortisone 10 MG tablet Commonly known as:  CORTEF Take 1 tablet (10 mg total) by mouth 2 (two) times daily. What changed:  medication strength  how much to take  when to take this  additional instructions  Another medication with the same name was removed. Continue taking this medication, and follow the directions you see here.   levETIRAcetam 100 MG/ML solution Commonly known as:  KEPPRA Take 10 mLs (1,000 mg total) by mouth 2 (two) times daily. Replaces:  levETIRAcetam 750 MG tablet       Day of Discharge BP 123/63   Pulse (!) 49   Temp 97.3 F (36.3 C) (Axillary)   Resp 17   Ht 5\' 7"  (1.702 m)   Wt 65.8 kg (145 lb)   SpO2 100%   BMI 22.71 kg/m   Physical Exam: General: No acute respiratory distress - alert and pleasant  Lungs: Clear to auscultation bilaterally without wheezes or crackles Cardiovascular: Regular rate and rhythm  Abdomen: Nontender, nondistended, soft Extremities: No significant edema bilateral lower extremities  Basic Metabolic Panel:  Recent Labs Lab 12/25/15 1218 12/25/15 1525 12/26/15 0231  NA 145 143 144  K 4.7 4.1 4.4  CL 104 109 108  CO2 31 27 25   GLUCOSE 165* 134* 119*  BUN 15 15 17   CREATININE 1.34* 1.16* 1.26*  CALCIUM 9.9 8.7* 8.9    Liver Function Tests:  Recent Labs Lab 12/25/15 1218 12/25/15 1525  AST 39 33  ALT 25 24  ALKPHOS 167* 134*  BILITOT 0.7 0.7  PROT 6.7 5.4*  ALBUMIN 3.2* 2.7*   CBC:  Recent Labs Lab 12/25/15 1218 12/26/15 0231  WBC 5.1 7.7  NEUTROABS 3.0  --   HGB 14.9 12.4  HCT 43.3 35.9*  MCV 87.5 85.5  PLT 215 195    Recent Results (from the past 240 hour(s))  Blood Culture (routine x 2)     Status: None   Collection Time: 12/25/15 12:20 PM  Result Value Ref Range Status   Specimen Description BLOOD RIGHT ANTECUBITAL  Final   Special Requests   Final    BOTTLES DRAWN AEROBIC AND ANAEROBIC 10CC AER 5CC ANA   Culture NO GROWTH 5 DAYS   Final   Report Status 12/30/2015 FINAL  Final  Urine culture     Status: Abnormal   Collection Time: 12/25/15  1:15 PM  Result Value Ref Range Status   Specimen Description URINE, CATHETERIZED  Final   Special Requests NONE  Final   Culture 20,000 COLONIES/mL YEAST (A)  Final   Report Status 12/26/2015 FINAL  Final  MRSA PCR Screening     Status: None   Collection Time: 12/25/15 10:33 PM  Result Value Ref Range Status   MRSA by PCR NEGATIVE NEGATIVE Final    Comment:        The GeneXpert MRSA Assay (FDA approved for NASAL specimens only), is one component of a comprehensive MRSA colonization surveillance program. It is not intended to diagnose MRSA infection nor to guide or monitor treatment for MRSA infections.   Blood Culture (routine x 2)     Status: None (Preliminary result)   Collection Time: 12/25/15 10:50 PM  Result Value Ref Range Status   Specimen Description BLOOD LEFT HAND  Final   Special Requests BOTTLES DRAWN AEROBIC ONLY 5CC  Final   Culture NO GROWTH 4 DAYS  Final   Report Status PENDING  Incomplete     Time spent in discharge (includes decision making & examination of pt): <30 minutes  12/30/2015, 11:50 AM   Cherene Altes, MD [facility] Office  [phone] Pager [phone]  On-Call/Text Page:      [email or web address]      password [facility]

## 2015-12-30 NOTE — Progress Notes (Signed)
CM received call from [facility], [place] called son of pt, [name] who confirmed he is ready to receive pt.  CM arranged for PTAR.  NO other CM needs were communicated.

## 2015-12-30 NOTE — Progress Notes (Signed)
Patient discharged home to caregiver with hospice support. Discharge instructions sent home with patient. Patient transported via [facility].

## 2015-12-31 LAB — CULTURE, BLOOD (ROUTINE X 2): Culture: NO GROWTH

## 2016-01-05 ENCOUNTER — Ambulatory Visit: Payer: Self-pay | Admitting: Nurse Practitioner

## 2016-03-21 DEATH — deceased

## 2018-09-03 IMAGING — DX DG CHEST 1V PORT
1 series · 1 of 1 positions shown · non-contrast
Comparison: Portable chest x-ray May 06, 2015

CLINICAL DATA: Sepsis, possible urinary tract infection. History of
previous CVA.

EXAM:
PORTABLE CHEST 1 VIEW

[chest ap]
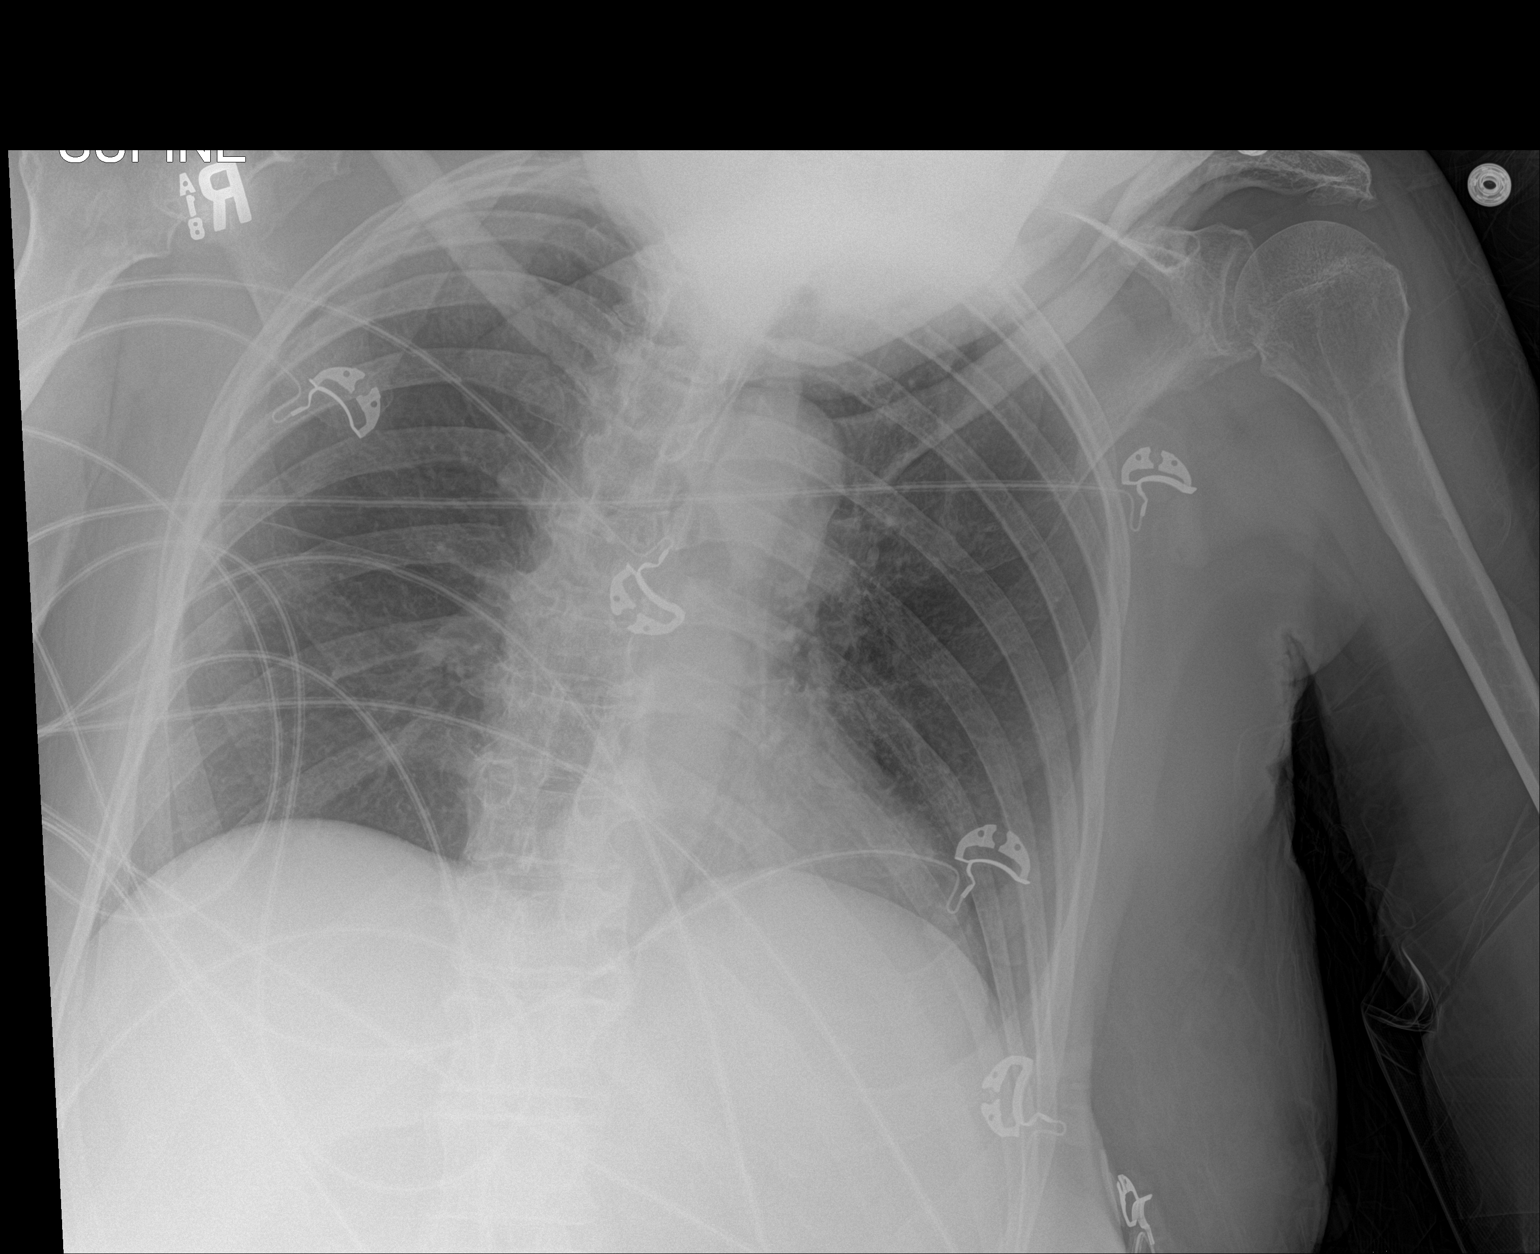

[1 of 1 positions shown; findings below may reference images not displayed]

FINDINGS: The lungs are adequately inflated and clear. The heart and pulmonary
vascularity are normal. The mediastinum is normal in width. The bony
thorax exhibits no acute abnormality.
IMPRESSION: There is no active cardiopulmonary disease.
